# Patient Record
Sex: Female | Born: 1945 | Race: Black or African American | Hispanic: No | Smoking: Former smoker
Health system: Southern US, Community
[De-identification: ages and names within clinical notes are randomized; demographics above are authoritative.]

## PROBLEM LIST (undated history)

## (undated) DIAGNOSIS — E7800 Pure hypercholesterolemia, unspecified: Secondary | ICD-10-CM

## (undated) DIAGNOSIS — E119 Type 2 diabetes mellitus without complications: Secondary | ICD-10-CM

## (undated) DIAGNOSIS — I10 Essential (primary) hypertension: Secondary | ICD-10-CM

## (undated) DIAGNOSIS — G4733 Obstructive sleep apnea (adult) (pediatric): Secondary | ICD-10-CM

## (undated) HISTORY — PX: ABDOMINAL HYSTERECTOMY: SHX81

## (undated) HISTORY — PX: FOOT SURGERY: SHX648

## (undated) HISTORY — DX: Obstructive sleep apnea (adult) (pediatric): G47.33

---

## 2014-12-26 DIAGNOSIS — I10 Essential (primary) hypertension: Secondary | ICD-10-CM | POA: Diagnosis not present

## 2014-12-26 DIAGNOSIS — Z0000 Encounter for general adult medical examination without abnormal findings: Secondary | ICD-10-CM | POA: Diagnosis not present

## 2014-12-26 DIAGNOSIS — Z79899 Other long term (current) drug therapy: Secondary | ICD-10-CM | POA: Diagnosis not present

## 2014-12-26 DIAGNOSIS — E785 Hyperlipidemia, unspecified: Secondary | ICD-10-CM | POA: Diagnosis not present

## 2014-12-26 DIAGNOSIS — E119 Type 2 diabetes mellitus without complications: Secondary | ICD-10-CM | POA: Diagnosis not present

## 2014-12-27 ENCOUNTER — Other Ambulatory Visit: Payer: Self-pay | Admitting: Family Medicine

## 2014-12-27 ENCOUNTER — Other Ambulatory Visit: Payer: Self-pay

## 2014-12-27 DIAGNOSIS — Z1231 Encounter for screening mammogram for malignant neoplasm of breast: Secondary | ICD-10-CM

## 2015-01-06 DIAGNOSIS — E785 Hyperlipidemia, unspecified: Secondary | ICD-10-CM | POA: Diagnosis not present

## 2015-01-06 DIAGNOSIS — E119 Type 2 diabetes mellitus without complications: Secondary | ICD-10-CM | POA: Diagnosis not present

## 2015-01-09 ENCOUNTER — Ambulatory Visit
Admission: RE | Admit: 2015-01-09 | Discharge: 2015-01-09 | Disposition: A | Discharge status: Home / Self Care | Payer: Commercial Managed Care - HMO | Source: Ambulatory Visit | Attending: Family Medicine | Admitting: Family Medicine

## 2015-01-09 ENCOUNTER — Encounter (INDEPENDENT_AMBULATORY_CARE_PROVIDER_SITE_OTHER): Payer: Self-pay

## 2015-01-09 DIAGNOSIS — Z1231 Encounter for screening mammogram for malignant neoplasm of breast: Secondary | ICD-10-CM

## 2015-04-09 DIAGNOSIS — E119 Type 2 diabetes mellitus without complications: Secondary | ICD-10-CM | POA: Diagnosis not present

## 2015-04-09 DIAGNOSIS — Z961 Presence of intraocular lens: Secondary | ICD-10-CM | POA: Diagnosis not present

## 2015-04-09 DIAGNOSIS — H26492 Other secondary cataract, left eye: Secondary | ICD-10-CM | POA: Diagnosis not present

## 2015-04-23 DIAGNOSIS — H26492 Other secondary cataract, left eye: Secondary | ICD-10-CM | POA: Diagnosis not present

## 2015-07-02 DIAGNOSIS — I10 Essential (primary) hypertension: Secondary | ICD-10-CM | POA: Diagnosis not present

## 2015-07-02 DIAGNOSIS — T464X5A Adverse effect of angiotensin-converting-enzyme inhibitors, initial encounter: Secondary | ICD-10-CM | POA: Diagnosis not present

## 2015-07-02 DIAGNOSIS — E119 Type 2 diabetes mellitus without complications: Secondary | ICD-10-CM | POA: Diagnosis not present

## 2015-07-02 DIAGNOSIS — E785 Hyperlipidemia, unspecified: Secondary | ICD-10-CM | POA: Diagnosis not present

## 2015-07-02 DIAGNOSIS — Z720 Tobacco use: Secondary | ICD-10-CM | POA: Diagnosis not present

## 2015-07-02 DIAGNOSIS — R05 Cough: Secondary | ICD-10-CM | POA: Diagnosis not present

## 2015-07-02 DIAGNOSIS — K5900 Constipation, unspecified: Secondary | ICD-10-CM | POA: Diagnosis not present

## 2015-07-02 DIAGNOSIS — R5383 Other fatigue: Secondary | ICD-10-CM | POA: Diagnosis not present

## 2015-07-17 DIAGNOSIS — R946 Abnormal results of thyroid function studies: Secondary | ICD-10-CM | POA: Diagnosis not present

## 2015-11-20 DIAGNOSIS — E119 Type 2 diabetes mellitus without complications: Secondary | ICD-10-CM | POA: Diagnosis not present

## 2015-11-20 DIAGNOSIS — Z961 Presence of intraocular lens: Secondary | ICD-10-CM | POA: Diagnosis not present

## 2015-11-20 DIAGNOSIS — H43812 Vitreous degeneration, left eye: Secondary | ICD-10-CM | POA: Diagnosis not present

## 2016-03-08 ENCOUNTER — Ambulatory Visit (HOSPITAL_COMMUNITY)
Admission: EM | Admit: 2016-03-08 | Discharge: 2016-03-08 | Disposition: A | Discharge status: Home / Self Care | Payer: No Typology Code available for payment source | Attending: Family Medicine | Admitting: Family Medicine

## 2016-03-08 ENCOUNTER — Encounter (HOSPITAL_COMMUNITY): Payer: Self-pay | Admitting: *Deleted

## 2016-03-08 DIAGNOSIS — M545 Low back pain: Secondary | ICD-10-CM | POA: Diagnosis not present

## 2016-03-08 DIAGNOSIS — Z041 Encounter for examination and observation following transport accident: Secondary | ICD-10-CM

## 2016-03-08 HISTORY — DX: Type 2 diabetes mellitus without complications: E11.9

## 2016-03-08 HISTORY — DX: Pure hypercholesterolemia, unspecified: E78.00

## 2016-03-08 HISTORY — DX: Essential (primary) hypertension: I10

## 2016-03-08 MED ORDER — TRAMADOL HCL 50 MG PO TABS: 50.0000 mg | ORAL_TABLET | Freq: Four times a day (QID) | ORAL | Status: DC | PRN
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
## 2016-03-22 ENCOUNTER — Other Ambulatory Visit: Payer: Self-pay | Admitting: Family Medicine

## 2016-03-22 DIAGNOSIS — E2839 Other primary ovarian failure: Secondary | ICD-10-CM

## 2016-03-22 DIAGNOSIS — Z1231 Encounter for screening mammogram for malignant neoplasm of breast: Secondary | ICD-10-CM

## 2016-04-13 ENCOUNTER — Ambulatory Visit
Admission: RE | Admit: 2016-04-13 | Discharge: 2016-04-13 | Disposition: A | Discharge status: Home / Self Care | Payer: Medicare Other | Source: Ambulatory Visit | Attending: Family Medicine | Admitting: Family Medicine

## 2016-04-13 DIAGNOSIS — E2839 Other primary ovarian failure: Secondary | ICD-10-CM

## 2016-04-13 DIAGNOSIS — Z1231 Encounter for screening mammogram for malignant neoplasm of breast: Secondary | ICD-10-CM

## 2016-09-06 ENCOUNTER — Ambulatory Visit (INDEPENDENT_AMBULATORY_CARE_PROVIDER_SITE_OTHER): Payer: Medicare Other

## 2016-09-06 ENCOUNTER — Ambulatory Visit (HOSPITAL_COMMUNITY)
Admission: EM | Admit: 2016-09-06 | Discharge: 2016-09-06 | Disposition: A | Discharge status: Home / Self Care | Payer: Medicare Other | Attending: Family Medicine | Admitting: Family Medicine

## 2016-09-06 ENCOUNTER — Encounter (HOSPITAL_COMMUNITY): Payer: Self-pay | Admitting: *Deleted

## 2016-09-06 DIAGNOSIS — J209 Acute bronchitis, unspecified: Secondary | ICD-10-CM | POA: Diagnosis not present

## 2016-09-06 MED ORDER — IPRATROPIUM BROMIDE 0.06 % NA SOLN: 2.0000 | Freq: Four times a day (QID) | NASAL | 1 refills | Status: DC

## 2016-09-06 MED ORDER — LEVOFLOXACIN 500 MG PO TABS: 500.0000 mg | ORAL_TABLET | Freq: Every day | ORAL | 0 refills | Status: DC

## 2016-09-06 MED ORDER — GUAIFENESIN-CODEINE 100-10 MG/5ML PO SYRP: 10.0000 mL | ORAL_SOLUTION | Freq: Four times a day (QID) | ORAL | 0 refills | Status: DC | PRN

## 2016-09-06 NOTE — ED Triage Notes (Signed)
Pt  Reports   About  4  Days  Ago  Developed  A  Dry  Non  Productive   Cough  With  stuffyness   Pt  Reports    Symptoms  Began  About  4  Days  Ago

## 2016-09-06 NOTE — ED Provider Notes (Signed)
[facility]    CSN: [id number] Arrival date & time: 09/06/16  1906     History   Chief Complaint Chief Complaint  Patient presents with  . Cough    HPI Jacqueline Barnett is a 70 y.o. female.   The history is provided by the patient.  Cough  Cough characteristics:  Non-productive, dry and harsh Severity:  Moderate Onset quality:  Gradual Duration:  4 days Progression:  Worsening Chronicity:  New Smoker: yes   Context: smoke exposure   Relieved by:  None tried Worsened by:  Nothing Ineffective treatments:  None tried Associated symptoms: no fever, no rhinorrhea and no sore throat     Past Medical History:  Diagnosis Date  . Diabetes mellitus without complication ([place])   . Hypercholesteremia   . Hypertension     There are no active problems to display for this patient.   Past Surgical History:  Procedure Laterality Date  . ABDOMINAL HYSTERECTOMY    . FOOT SURGERY      OB History    No data available       Home Medications    Prior to Admission medications   Medication Sig Start Date End Date Taking? Authorizing Provider  aspirin 81 MG chewable tablet Chew by mouth daily.   Yes Historical Provider, MD  hydrochlorothiazide (HYDRODIURIL) 25 MG tablet Take 25 mg by mouth daily.   Yes Historical Provider, MD  losartan (COZAAR) 50 MG tablet Take 50 mg by mouth daily.   Yes Historical Provider, MD  METFORMIN HCL PO Take by mouth.    Historical Provider, MD  Omeprazole (PRILOSEC PO) Take by mouth.    Historical Provider, MD  traMADol (ULTRAM) 50 MG tablet Take 1 tablet (50 mg total) by mouth every 6 (six) hours as needed. 03/08/16   Billy Fischer, MD    Family History History reviewed. No pertinent family history.  Social History Social History  Substance Use Topics  . Smoking status: Current Every Day Smoker  . Smokeless tobacco: Not on file  . Alcohol use No     Allergies   Patient has no known allergies.   Review of Systems Review of  Systems  Constitutional: Negative.  Negative for fever.  HENT: Positive for congestion. Negative for rhinorrhea and sore throat.   Respiratory: Positive for cough.   Cardiovascular: Negative.   Gastrointestinal: Negative.   All other systems reviewed and are negative.    Physical Exam Triage Vital Signs ED Triage Vitals [09/06/16 1946]  Enc Vitals Group     BP 151/84     Pulse Rate 68     Resp 16     Temp 98.1 F (36.7 C)     Temp Source Oral     SpO2 99 %     Weight      Height      Head Circumference      Peak Flow      Pain Score      Pain Loc      Pain Edu?      Excl. in [place]?    No data found.   Updated Vital Signs BP 151/84 (BP Location: Left Arm)   Pulse 68   Temp 98.1 F (36.7 C) (Oral)   Resp 16   SpO2 99%   Visual Acuity Right Eye Distance:   Left Eye Distance:   Bilateral Distance:    Right Eye Near:   Left Eye Near:    Bilateral Near:  Physical Exam  Constitutional: She is oriented to person, place, and time. She appears well-developed and well-nourished.  HENT:  Mouth/Throat: Oropharynx is clear and moist.  Eyes: Conjunctivae are normal. Pupils are equal, round, and reactive to light.  Neck: Normal range of motion. Neck supple.  Cardiovascular: Normal rate, regular rhythm, normal heart sounds and intact distal pulses.   Pulmonary/Chest: Effort normal.  Lymphadenopathy:    She has no cervical adenopathy.  Neurological: She is alert and oriented to person, place, and time.  Skin: Skin is warm and dry.  Nursing note and vitals reviewed.    UC Treatments / Results  Labs (all labs ordered are listed, but only abnormal results are displayed) Labs Reviewed - No data to display  EKG  EKG Interpretation None       Radiology No results found. X-rays reviewed and report per radiologist.  Procedures Procedures (including critical care time)  Medications Ordered in UC Medications - No data to display   Initial Impression /  Assessment and Plan / UC Course  I have reviewed the triage vital signs and the nursing notes.  Pertinent labs & imaging results that were available during my care of the patient were reviewed by me and considered in my medical decision making (see chart for details).  Clinical Course       Final Clinical Impressions(s) / UC Diagnoses   Final diagnoses:  None    New Prescriptions New Prescriptions   No medications on file     Billy Fischer, MD 09/06/16 2048

## 2016-09-06 NOTE — Discharge Instructions (Signed)
Drink plenty of fluids as discussed, use medicine as prescribed .Return or see your doctor if further problems . No more smoking.

## 2016-11-23 ENCOUNTER — Ambulatory Visit (INDEPENDENT_AMBULATORY_CARE_PROVIDER_SITE_OTHER): Payer: Medicare Other | Admitting: Orthopaedic Surgery

## 2016-11-23 ENCOUNTER — Ambulatory Visit (INDEPENDENT_AMBULATORY_CARE_PROVIDER_SITE_OTHER): Payer: Medicare Other

## 2016-11-23 DIAGNOSIS — G8929 Other chronic pain: Secondary | ICD-10-CM

## 2016-11-23 DIAGNOSIS — M25561 Pain in right knee: Secondary | ICD-10-CM

## 2016-11-23 NOTE — Progress Notes (Signed)
   Office Visit Note   Patient: Jacqueline Barnett           Date of Birth: [birth year]           MRN: [id number] Visit Date: 11/23/2016              Requested by: Aura Dials, MD [phone] [address], [place], [place] [address] PCP: PROVIDER NOT IN SYSTEM   Assessment & Plan: Visit Diagnoses:  1. Chronic pain of right knee     Plan: right knee OA.  Injection given today. monovisc pamphlet and HEP given.  F/u prn.  Follow-Up Instructions: Return if symptoms worsen or fail to improve.   Orders:  Orders Placed This Encounter  Procedures  . XR KNEE 3 VIEW RIGHT   No orders of the defined types were placed in this encounter.     Procedures: No procedures performed   Clinical Data: No additional findings.   Subjective: Chief Complaint  Patient presents with  . Right Knee - Pain    71 yo female with right knee pain for 1.5 months after cleaning her bathtub.  Has generalized knee throbbing, stabbing pain with soreness.  It is stiff and aches all over.  Denies mechanical sxs.  Sometimes it radiates down to shin.      Review of Systems  Constitutional: Negative.   HENT: Negative.   Eyes: Negative.   Respiratory: Negative.   Cardiovascular: Negative.   Endocrine: Negative.   Musculoskeletal: Negative.   Neurological: Negative.   Hematological: Negative.   Psychiatric/Behavioral: Negative.   All other systems reviewed and are negative.    Objective: Vital Signs: There were no vitals taken for this visit.  Physical Exam  Constitutional: She is oriented to person, place, and time. She appears well-developed and well-nourished.  HENT:  Head: Normocephalic and atraumatic.  Eyes: EOM are normal.  Neck: Neck supple.  Pulmonary/Chest: Effort normal.  Abdominal: Soft.  Neurological: She is alert and oriented to person, place, and time.  Skin: Skin is warm. Capillary refill takes less than 2 seconds.  Psychiatric: She has a normal mood and affect. Her behavior is  normal. Judgment and thought content normal.  Nursing note and vitals reviewed.   Ortho Exam Right knee - no joint effusion - supple ROM - ligaments stable Specialty Comments:  No specialty comments available.  Imaging: Xr Knee 3 View Right  Result Date: 11/23/2016 Moderate DJD of right knee more pronounced in medial compartment    PMFS History: There are no active problems to display for this patient.  Past Medical History:  Diagnosis Date  . Diabetes mellitus without complication ([place])   . Hypercholesteremia   . Hypertension     No family history on file.  Past Surgical History:  Procedure Laterality Date  . ABDOMINAL HYSTERECTOMY    . FOOT SURGERY     Social History   Occupational History  . Not on file.   Social History Main Topics  . Smoking status: Current Every Day Smoker  . Smokeless tobacco: Not on file  . Alcohol use No  . Drug use: Unknown  . Sexual activity: Not on file

## 2017-02-01 ENCOUNTER — Encounter (INDEPENDENT_AMBULATORY_CARE_PROVIDER_SITE_OTHER): Payer: Self-pay

## 2017-02-01 ENCOUNTER — Ambulatory Visit (INDEPENDENT_AMBULATORY_CARE_PROVIDER_SITE_OTHER): Payer: Medicare Other | Admitting: Orthopaedic Surgery

## 2017-02-01 ENCOUNTER — Encounter (INDEPENDENT_AMBULATORY_CARE_PROVIDER_SITE_OTHER): Payer: Self-pay | Admitting: Orthopaedic Surgery

## 2017-02-01 DIAGNOSIS — M25561 Pain in right knee: Secondary | ICD-10-CM | POA: Diagnosis not present

## 2017-02-01 MED ORDER — MELOXICAM 7.5 MG PO TABS: 7.5000 mg | ORAL_TABLET | Freq: Two times a day (BID) | ORAL | 2 refills | Status: DC | PRN

## 2017-02-01 NOTE — Progress Notes (Signed)
   Office Visit Note   Patient: Jacqueline Barnett           Date of Birth: [birth year]           MRN: [id number] Visit Date: 02/01/2017              Requested by: No referring provider defined for this encounter. PCP: PROVIDER NOT IN SYSTEM   Assessment & Plan: Visit Diagnoses: No diagnosis found.  Plan: Recommend MRI to rule out medial meniscal tear in medial tibial plateau insufficiency fracture. Meloxicam prescribed. Follow-up in about 10 days to review the MRI. If MRI does not show meniscal tear we'll submit application from Monovisc injection.  Follow-Up Instructions: Return in about 10 days (around 02/11/2017).   Orders:  No orders of the defined types were placed in this encounter.  Meds ordered this encounter  Medications  . meloxicam (MOBIC) 7.5 MG tablet    Sig: Take 1 tablet (7.5 mg total) by mouth 2 (two) times daily as needed for pain.    Dispense:  30 tablet    Refill:  2      Procedures: No procedures performed   Clinical Data: No additional findings.   Subjective: Chief Complaint  Patient presents with  . Right Knee - Pain, Follow-up    Patient follows up today for continued right knee pain. The cortisone injection gave her one week relief. She continues to have giving way and popping significant mechanical symptoms.    Review of Systems  Constitutional: Negative.   HENT: Negative.   Eyes: Negative.   Respiratory: Negative.   Cardiovascular: Negative.   Endocrine: Negative.   Musculoskeletal: Negative.   Neurological: Negative.   Hematological: Negative.   Psychiatric/Behavioral: Negative.   All other systems reviewed and are negative.    Objective: Vital Signs: There were no vitals taken for this visit.  Physical Exam  Constitutional: She is oriented to person, place, and time. She appears well-developed and well-nourished.  Pulmonary/Chest: Effort normal.  Neurological: She is alert and oriented to person, place, and time.  Skin: Skin is  warm. Capillary refill takes less than 2 seconds.  Psychiatric: She has a normal mood and affect. Her behavior is normal. Judgment and thought content normal.  Nursing note and vitals reviewed.   Ortho Exam Right knee exam shows no joint effusion. She does have significant medial joint line tenderness and proximal medial tibia pain with palpation. Collaterals and cruciates are stable. Specialty Comments:  No specialty comments available.  Imaging: No results found.   PMFS History: There are no active problems to display for this patient.  Past Medical History:  Diagnosis Date  . Diabetes mellitus without complication ([place])   . Hypercholesteremia   . Hypertension     No family history on file.  Past Surgical History:  Procedure Laterality Date  . ABDOMINAL HYSTERECTOMY    . FOOT SURGERY     Social History   Occupational History  . Not on file.   Social History Main Topics  . Smoking status: Current Every Day Smoker  . Smokeless tobacco: Never Used  . Alcohol use No  . Drug use: Unknown  . Sexual activity: Not on file

## 2017-02-15 ENCOUNTER — Encounter (INDEPENDENT_AMBULATORY_CARE_PROVIDER_SITE_OTHER): Payer: Self-pay

## 2017-02-15 ENCOUNTER — Ambulatory Visit (INDEPENDENT_AMBULATORY_CARE_PROVIDER_SITE_OTHER): Payer: Medicare Other | Admitting: Orthopaedic Surgery

## 2017-02-15 ENCOUNTER — Encounter (INDEPENDENT_AMBULATORY_CARE_PROVIDER_SITE_OTHER): Payer: Self-pay | Admitting: Orthopaedic Surgery

## 2017-02-15 DIAGNOSIS — M25561 Pain in right knee: Secondary | ICD-10-CM

## 2017-02-18 ENCOUNTER — Ambulatory Visit (INDEPENDENT_AMBULATORY_CARE_PROVIDER_SITE_OTHER): Payer: Medicare Other | Admitting: Orthopaedic Surgery

## 2017-02-18 ENCOUNTER — Encounter (INDEPENDENT_AMBULATORY_CARE_PROVIDER_SITE_OTHER): Payer: Self-pay

## 2017-02-21 ENCOUNTER — Telehealth (INDEPENDENT_AMBULATORY_CARE_PROVIDER_SITE_OTHER): Payer: Self-pay

## 2017-02-21 NOTE — Telephone Encounter (Signed)
Called patient to let her know Monovisc was approved by ins company and it will pay 80% of inj. I advised her she could call insurance company to know exactly what she will have to pay. She will call us to let us know if she would like to go ahead and schedule appt.

## 2017-02-23 ENCOUNTER — Ambulatory Visit
Admission: RE | Admit: 2017-02-23 | Discharge: 2017-02-23 | Disposition: A | Discharge status: Home / Self Care | Payer: Medicare Other | Source: Ambulatory Visit | Attending: Orthopaedic Surgery | Admitting: Orthopaedic Surgery

## 2017-02-23 DIAGNOSIS — M25561 Pain in right knee: Secondary | ICD-10-CM

## 2017-02-25 ENCOUNTER — Ambulatory Visit (INDEPENDENT_AMBULATORY_CARE_PROVIDER_SITE_OTHER): Payer: Medicare Other | Admitting: Orthopaedic Surgery

## 2017-02-25 DIAGNOSIS — S83281A Other tear of lateral meniscus, current injury, right knee, initial encounter: Secondary | ICD-10-CM | POA: Diagnosis not present

## 2017-02-25 MED ORDER — DICLOFENAC SODIUM 75 MG PO TBEC: 75.0000 mg | DELAYED_RELEASE_TABLET | Freq: Two times a day (BID) | ORAL | 2 refills | Status: DC

## 2017-02-25 NOTE — Progress Notes (Signed)
   Office Visit Note   Patient: Jacqueline Barnett           Date of Birth: [birth year]           MRN: [id number] Visit Date: 02/25/2017              Requested by: No referring provider defined for this encounter. PCP: Patient, No Pcp Per   Assessment & Plan: Visit Diagnoses:  1. Acute lateral meniscus tear of right knee, initial encounter     Plan: MRI shows mild to moderate degenerative joint disease with lateral meniscal tear. She also has a degenerated medial meniscus. I discussed these findings with her and her daughter. We discussed that we will wait to hear about her Monovisc injection and if the co-pays too much we can consider arthroscopic debridement but she understands that there is a risk that he may not give her pain relief with the procedure. She understands and she'll let us know. I prescribed her diclofenac.  Follow-Up Instructions: Return if symptoms worsen or fail to improve.   Orders:  No orders of the defined types were placed in this encounter.  Meds ordered this encounter  Medications  . diclofenac (VOLTAREN) 75 MG EC tablet    Sig: Take 1 tablet (75 mg total) by mouth 2 (two) times daily.    Dispense:  30 tablet    Refill:  2      Procedures: No procedures performed   Clinical Data: No additional findings.   Subjective: No chief complaint on file.   Patient follows up today to review her MRI. She continues to have pain of her right knee with subjective mechanical symptoms. She denies any swelling.    Review of Systems  Constitutional: Negative.   HENT: Negative.   Eyes: Negative.   Respiratory: Negative.   Cardiovascular: Negative.   Endocrine: Negative.   Musculoskeletal: Negative.   Neurological: Negative.   Hematological: Negative.   Psychiatric/Behavioral: Negative.   All other systems reviewed and are negative.    Objective: Vital Signs: There were no vitals taken for this visit.  Physical Exam  Constitutional: She is oriented to  person, place, and time. She appears well-developed and well-nourished.  Pulmonary/Chest: Effort normal.  Neurological: She is alert and oriented to person, place, and time.  Skin: Skin is warm. Capillary refill takes less than 2 seconds.  Psychiatric: She has a normal mood and affect. Her behavior is normal. Judgment and thought content normal.  Nursing note and vitals reviewed.   Ortho Exam Right knee exam shows no joint effusion. She has medial joint line tenderness. She has pain with McMurray testing on the medial side. Specialty Comments:  No specialty comments available.  Imaging: No results found.   PMFS History: There are no active problems to display for this patient.  Past Medical History:  Diagnosis Date  . Diabetes mellitus without complication ([place])   . Hypercholesteremia   . Hypertension     No family history on file.  Past Surgical History:  Procedure Laterality Date  . ABDOMINAL HYSTERECTOMY    . FOOT SURGERY     Social History   Occupational History  . Not on file.   Social History Main Topics  . Smoking status: Current Every Day Smoker  . Smokeless tobacco: Never Used  . Alcohol use No  . Drug use: Unknown  . Sexual activity: Not on file

## 2017-03-07 ENCOUNTER — Other Ambulatory Visit: Payer: Self-pay | Admitting: Internal Medicine

## 2017-03-07 DIAGNOSIS — Z1231 Encounter for screening mammogram for malignant neoplasm of breast: Secondary | ICD-10-CM

## 2017-04-14 ENCOUNTER — Ambulatory Visit
Admission: RE | Admit: 2017-04-14 | Discharge: 2017-04-14 | Disposition: A | Discharge status: Home / Self Care | Payer: Medicare Other | Source: Ambulatory Visit | Attending: Internal Medicine | Admitting: Internal Medicine

## 2017-04-14 DIAGNOSIS — Z1231 Encounter for screening mammogram for malignant neoplasm of breast: Secondary | ICD-10-CM

## 2017-04-15 ENCOUNTER — Other Ambulatory Visit: Payer: Self-pay | Admitting: Internal Medicine

## 2017-04-15 DIAGNOSIS — R928 Other abnormal and inconclusive findings on diagnostic imaging of breast: Secondary | ICD-10-CM

## 2017-04-21 ENCOUNTER — Other Ambulatory Visit: Payer: Self-pay | Admitting: Internal Medicine

## 2017-04-21 ENCOUNTER — Ambulatory Visit
Admission: RE | Admit: 2017-04-21 | Discharge: 2017-04-21 | Disposition: A | Discharge status: Home / Self Care | Payer: Medicare Other | Source: Ambulatory Visit | Attending: Internal Medicine | Admitting: Internal Medicine

## 2017-04-21 DIAGNOSIS — R928 Other abnormal and inconclusive findings on diagnostic imaging of breast: Secondary | ICD-10-CM

## 2017-04-21 DIAGNOSIS — M7989 Other specified soft tissue disorders: Secondary | ICD-10-CM

## 2017-05-14 NOTE — Progress Notes (Signed)
cancelled

## 2017-05-27 ENCOUNTER — Other Ambulatory Visit: Payer: Self-pay | Admitting: Internal Medicine

## 2017-05-27 DIAGNOSIS — E041 Nontoxic single thyroid nodule: Secondary | ICD-10-CM

## 2017-05-27 DIAGNOSIS — F17210 Nicotine dependence, cigarettes, uncomplicated: Secondary | ICD-10-CM

## 2017-06-13 ENCOUNTER — Ambulatory Visit
Admission: RE | Admit: 2017-06-13 | Discharge: 2017-06-13 | Disposition: A | Discharge status: Home / Self Care | Payer: Medicare Other | Source: Ambulatory Visit | Attending: Internal Medicine | Admitting: Internal Medicine

## 2017-06-13 DIAGNOSIS — E041 Nontoxic single thyroid nodule: Secondary | ICD-10-CM

## 2017-06-13 DIAGNOSIS — F17210 Nicotine dependence, cigarettes, uncomplicated: Secondary | ICD-10-CM

## 2017-06-16 ENCOUNTER — Other Ambulatory Visit: Payer: Self-pay | Admitting: Internal Medicine

## 2017-06-16 DIAGNOSIS — E041 Nontoxic single thyroid nodule: Secondary | ICD-10-CM

## 2017-06-22 ENCOUNTER — Telehealth (INDEPENDENT_AMBULATORY_CARE_PROVIDER_SITE_OTHER): Payer: Self-pay

## 2017-06-22 NOTE — Telephone Encounter (Signed)
Patient called [facility] and they said they will ship out injection on 9/25/018. Scheduled appt for 07/04/17.

## 2017-06-22 NOTE — Telephone Encounter (Signed)
Received an update from [facility] they said that patient has to call [facility] [phone]  To give consent so they can ship the Injection out. Patient states she was approved for financial assistance for the medication.   Called patient for to call [facility] and patient will call me back after speaking to them.  MONOVISC RIGHT KNEE

## 2017-06-23 ENCOUNTER — Other Ambulatory Visit (HOSPITAL_COMMUNITY)
Admission: RE | Admit: 2017-06-23 | Discharge: 2017-06-23 | Disposition: A | Discharge status: Home / Self Care | Payer: Medicare Other | Source: Ambulatory Visit | Attending: Diagnostic Radiology | Admitting: Diagnostic Radiology

## 2017-06-23 ENCOUNTER — Ambulatory Visit
Admission: RE | Admit: 2017-06-23 | Discharge: 2017-06-23 | Disposition: A | Discharge status: Home / Self Care | Payer: Medicare Other | Source: Ambulatory Visit | Attending: Internal Medicine | Admitting: Internal Medicine

## 2017-06-23 DIAGNOSIS — E041 Nontoxic single thyroid nodule: Secondary | ICD-10-CM | POA: Insufficient documentation

## 2017-07-04 ENCOUNTER — Ambulatory Visit (INDEPENDENT_AMBULATORY_CARE_PROVIDER_SITE_OTHER): Payer: Medicare Other | Admitting: Orthopaedic Surgery

## 2017-07-11 ENCOUNTER — Ambulatory Visit (INDEPENDENT_AMBULATORY_CARE_PROVIDER_SITE_OTHER): Payer: Medicare Other | Admitting: Orthopaedic Surgery

## 2017-07-11 DIAGNOSIS — M1711 Unilateral primary osteoarthritis, right knee: Secondary | ICD-10-CM

## 2017-07-11 MED ORDER — HYALURONAN 88 MG/4ML IX SOSY
88.0000 mg | PREFILLED_SYRINGE | INTRA_ARTICULAR | Status: AC | PRN
  Administered 2017-07-11: 88 mg via INTRA_ARTICULAR

## 2017-07-11 NOTE — Progress Notes (Signed)
   Procedure Note  Patient: Jacqueline Barnett             Date of Birth: [birth year]           MRN: [id number]             Visit Date: 07/11/2017  Procedures: Visit Diagnoses: Unilateral primary osteoarthritis, right knee  Large Joint Inj Date/Time: 07/11/2017 4:40 PM Performed by: Leandrew Koyanagi Authorized by: Leandrew Koyanagi   Consent Given by:  Patient Timeout: prior to procedure the correct patient, procedure, and site was verified   Indications:  Pain Location:  Knee Site:  R knee Prep: patient was prepped and draped in usual sterile fashion   Needle Size:  22 G Approach:  Anterolateral Ultrasound Guidance: No   Fluoroscopic Guidance: No   Arthrogram: No   Medications:  88 mg Hyaluronan 88 MG/4ML

## 2017-07-26 ENCOUNTER — Ambulatory Visit
Admission: RE | Admit: 2017-07-26 | Discharge: 2017-07-26 | Disposition: A | Discharge status: Home / Self Care | Payer: Medicare Other | Source: Ambulatory Visit | Attending: Internal Medicine | Admitting: Internal Medicine

## 2017-07-26 DIAGNOSIS — M7989 Other specified soft tissue disorders: Secondary | ICD-10-CM

## 2017-09-05 ENCOUNTER — Encounter (INDEPENDENT_AMBULATORY_CARE_PROVIDER_SITE_OTHER): Payer: Self-pay | Admitting: Orthopaedic Surgery

## 2017-09-05 ENCOUNTER — Ambulatory Visit (INDEPENDENT_AMBULATORY_CARE_PROVIDER_SITE_OTHER): Payer: Medicare Other | Admitting: Orthopaedic Surgery

## 2017-09-05 DIAGNOSIS — M1712 Unilateral primary osteoarthritis, left knee: Secondary | ICD-10-CM

## 2017-09-05 MED ORDER — DICLOFENAC SODIUM 75 MG PO TBEC: 75.0000 mg | DELAYED_RELEASE_TABLET | Freq: Two times a day (BID) | ORAL | 2 refills | Status: DC

## 2017-09-05 MED ORDER — METHYLPREDNISOLONE ACETATE 40 MG/ML IJ SUSP
40.0000 mg | INTRAMUSCULAR | Status: AC | PRN
  Administered 2017-09-05: 40 mg via INTRA_ARTICULAR

## 2017-09-05 MED ORDER — LIDOCAINE HCL 1 % IJ SOLN
2.0000 mL | INTRAMUSCULAR | Status: AC | PRN
  Administered 2017-09-05: 2 mL

## 2017-09-05 MED ORDER — DICLOFENAC SODIUM 1 % TD GEL: 2.0000 g | Freq: Four times a day (QID) | TRANSDERMAL | 5 refills | Status: DC

## 2017-09-05 MED ORDER — BUPIVACAINE HCL 0.5 % IJ SOLN
2.0000 mL | INTRAMUSCULAR | Status: AC | PRN
  Administered 2017-09-05: 2 mL via INTRA_ARTICULAR

## 2017-09-05 NOTE — Progress Notes (Signed)
Office Visit Note   Patient: Jacqueline Barnett           Date of Birth: May 19, 1946           MRN: [id number] Visit Date: 09/05/2017              Requested by: No referring provider defined for this encounter. PCP: Patient, No Pcp Per   Assessment & Plan: Visit Diagnoses:  1. Unilateral primary osteoarthritis, left knee     Plan: Left knee was aspirated and injected today.  Prescription for topical and oral diclofenac.  She will follow-up when she is ready for a Monovisc injection for the right knee.    Follow-Up Instructions: Return if symptoms worsen or fail to improve.   Orders:  No orders of the defined types were placed in this encounter.  Meds ordered this encounter  Medications  . diclofenac (VOLTAREN) 75 MG EC tablet    Sig: Take 1 tablet (75 mg total) by mouth 2 (two) times daily.    Dispense:  30 tablet    Refill:  2  . diclofenac sodium (VOLTAREN) 1 % GEL    Sig: Apply 2 g topically 4 (four) times daily.    Dispense:  1 Tube    Refill:  5      Procedures: Large Joint Inj: L knee on 09/05/2017 8:31 PM Details: 22 G needle Medications: 2 mL bupivacaine 0.5 %; 2 mL lidocaine 1 %; 40 mg methylPREDNISolone acetate 40 MG/ML Aspirate: 25 mL blood-tinged Outcome: tolerated well, no immediate complications Patient was prepped and draped in the usual sterile fashion.       Clinical Data: No additional findings.   Subjective: Chief Complaint  Patient presents with  . Right Knee - Pain  . Left Knee - Pain    Patient follows up today for mainly left knee pain and swelling.  She received a Monovisc injections in her right knee in October and she has improved since then.  She denies any injuries to her left knee.    Review of Systems  Constitutional: Negative.   HENT: Negative.   Eyes: Negative.   Respiratory: Negative.   Cardiovascular: Negative.   Endocrine: Negative.   Musculoskeletal: Negative.   Neurological: Negative.   Hematological: Negative.     Psychiatric/Behavioral: Negative.   All other systems reviewed and are negative.    Objective: Vital Signs: There were no vitals taken for this visit.  Physical Exam  Constitutional: She is oriented to person, place, and time. She appears well-developed and well-nourished.  Pulmonary/Chest: Effort normal.  Neurological: She is alert and oriented to person, place, and time.  Skin: Skin is warm. Capillary refill takes less than 2 seconds.  Psychiatric: She has a normal mood and affect. Her behavior is normal. Judgment and thought content normal.  Nursing note and vitals reviewed.   Ortho Exam Left knee exam shows no effusion.  Collaterals and cruciates are stable. Specialty Comments:  No specialty comments available.  Imaging: No results found.   PMFS History: There are no active problems to display for this patient.  Past Medical History:  Diagnosis Date  . Diabetes mellitus without complication ([place])   . Hypercholesteremia   . Hypertension     History reviewed. No pertinent family history.  Past Surgical History:  Procedure Laterality Date  . ABDOMINAL HYSTERECTOMY    . FOOT SURGERY     Social History   Occupational History  . Not on file  Tobacco Use  .  Smoking status: Current Every Day Smoker  . Smokeless tobacco: Never Used  Substance and Sexual Activity  . Alcohol use: No  . Drug use: Not on file  . Sexual activity: Not on file

## 2017-10-10 DIAGNOSIS — E785 Hyperlipidemia, unspecified: Secondary | ICD-10-CM | POA: Diagnosis not present

## 2017-10-10 DIAGNOSIS — Z23 Encounter for immunization: Secondary | ICD-10-CM | POA: Diagnosis not present

## 2017-10-10 DIAGNOSIS — E119 Type 2 diabetes mellitus without complications: Secondary | ICD-10-CM | POA: Diagnosis not present

## 2017-10-10 DIAGNOSIS — J439 Emphysema, unspecified: Secondary | ICD-10-CM | POA: Diagnosis not present

## 2017-10-10 DIAGNOSIS — E041 Nontoxic single thyroid nodule: Secondary | ICD-10-CM | POA: Diagnosis not present

## 2017-10-10 DIAGNOSIS — I10 Essential (primary) hypertension: Secondary | ICD-10-CM | POA: Diagnosis not present

## 2017-11-08 DIAGNOSIS — E119 Type 2 diabetes mellitus without complications: Secondary | ICD-10-CM | POA: Diagnosis not present

## 2017-11-08 DIAGNOSIS — Z961 Presence of intraocular lens: Secondary | ICD-10-CM | POA: Diagnosis not present

## 2017-11-08 DIAGNOSIS — H43812 Vitreous degeneration, left eye: Secondary | ICD-10-CM | POA: Diagnosis not present

## 2017-11-08 DIAGNOSIS — R6889 Other general symptoms and signs: Secondary | ICD-10-CM | POA: Diagnosis not present

## 2017-12-23 DIAGNOSIS — E119 Type 2 diabetes mellitus without complications: Secondary | ICD-10-CM | POA: Diagnosis not present

## 2017-12-23 DIAGNOSIS — E785 Hyperlipidemia, unspecified: Secondary | ICD-10-CM | POA: Diagnosis not present

## 2017-12-23 DIAGNOSIS — J439 Emphysema, unspecified: Secondary | ICD-10-CM | POA: Diagnosis not present

## 2017-12-23 DIAGNOSIS — I10 Essential (primary) hypertension: Secondary | ICD-10-CM | POA: Diagnosis not present

## 2017-12-23 DIAGNOSIS — Z7984 Long term (current) use of oral hypoglycemic drugs: Secondary | ICD-10-CM | POA: Diagnosis not present

## 2018-01-16 DIAGNOSIS — I10 Essential (primary) hypertension: Secondary | ICD-10-CM | POA: Diagnosis not present

## 2018-01-16 DIAGNOSIS — J439 Emphysema, unspecified: Secondary | ICD-10-CM | POA: Diagnosis not present

## 2018-01-16 DIAGNOSIS — E119 Type 2 diabetes mellitus without complications: Secondary | ICD-10-CM | POA: Diagnosis not present

## 2018-01-16 DIAGNOSIS — E1169 Type 2 diabetes mellitus with other specified complication: Secondary | ICD-10-CM | POA: Diagnosis not present

## 2018-01-16 DIAGNOSIS — Z7984 Long term (current) use of oral hypoglycemic drugs: Secondary | ICD-10-CM | POA: Diagnosis not present

## 2018-01-16 DIAGNOSIS — E785 Hyperlipidemia, unspecified: Secondary | ICD-10-CM | POA: Diagnosis not present

## 2018-02-06 DIAGNOSIS — E041 Nontoxic single thyroid nodule: Secondary | ICD-10-CM | POA: Diagnosis not present

## 2018-02-06 DIAGNOSIS — R6889 Other general symptoms and signs: Secondary | ICD-10-CM | POA: Diagnosis not present

## 2018-02-06 DIAGNOSIS — E785 Hyperlipidemia, unspecified: Secondary | ICD-10-CM | POA: Diagnosis not present

## 2018-02-06 DIAGNOSIS — I10 Essential (primary) hypertension: Secondary | ICD-10-CM | POA: Diagnosis not present

## 2018-02-06 DIAGNOSIS — Z7984 Long term (current) use of oral hypoglycemic drugs: Secondary | ICD-10-CM | POA: Diagnosis not present

## 2018-02-06 DIAGNOSIS — J439 Emphysema, unspecified: Secondary | ICD-10-CM | POA: Diagnosis not present

## 2018-02-06 DIAGNOSIS — E1169 Type 2 diabetes mellitus with other specified complication: Secondary | ICD-10-CM | POA: Diagnosis not present

## 2018-05-18 ENCOUNTER — Other Ambulatory Visit: Payer: Self-pay | Admitting: Internal Medicine

## 2018-05-18 DIAGNOSIS — Z1231 Encounter for screening mammogram for malignant neoplasm of breast: Secondary | ICD-10-CM

## 2018-06-19 DIAGNOSIS — K5904 Chronic idiopathic constipation: Secondary | ICD-10-CM | POA: Diagnosis not present

## 2018-06-19 DIAGNOSIS — R6889 Other general symptoms and signs: Secondary | ICD-10-CM | POA: Diagnosis not present

## 2018-06-19 DIAGNOSIS — Z7189 Other specified counseling: Secondary | ICD-10-CM | POA: Diagnosis not present

## 2018-06-19 DIAGNOSIS — E042 Nontoxic multinodular goiter: Secondary | ICD-10-CM | POA: Diagnosis not present

## 2018-06-19 DIAGNOSIS — J869 Pyothorax without fistula: Secondary | ICD-10-CM | POA: Diagnosis not present

## 2018-06-19 DIAGNOSIS — Z1159 Encounter for screening for other viral diseases: Secondary | ICD-10-CM | POA: Diagnosis not present

## 2018-06-19 DIAGNOSIS — E785 Hyperlipidemia, unspecified: Secondary | ICD-10-CM | POA: Diagnosis not present

## 2018-06-19 DIAGNOSIS — I10 Essential (primary) hypertension: Secondary | ICD-10-CM | POA: Diagnosis not present

## 2018-06-19 DIAGNOSIS — E119 Type 2 diabetes mellitus without complications: Secondary | ICD-10-CM | POA: Diagnosis not present

## 2018-06-19 DIAGNOSIS — Z1389 Encounter for screening for other disorder: Secondary | ICD-10-CM | POA: Diagnosis not present

## 2018-06-19 DIAGNOSIS — I700 Atherosclerosis of aorta: Secondary | ICD-10-CM | POA: Diagnosis not present

## 2018-06-19 DIAGNOSIS — Z0000 Encounter for general adult medical examination without abnormal findings: Secondary | ICD-10-CM | POA: Diagnosis not present

## 2018-06-19 DIAGNOSIS — Z23 Encounter for immunization: Secondary | ICD-10-CM | POA: Diagnosis not present

## 2018-06-20 ENCOUNTER — Ambulatory Visit
Admission: RE | Admit: 2018-06-20 | Discharge: 2018-06-20 | Disposition: A | Discharge status: Home / Self Care | Payer: Medicare HMO | Source: Ambulatory Visit | Attending: Internal Medicine | Admitting: Internal Medicine

## 2018-06-20 DIAGNOSIS — Z1231 Encounter for screening mammogram for malignant neoplasm of breast: Secondary | ICD-10-CM

## 2018-06-20 DIAGNOSIS — R6889 Other general symptoms and signs: Secondary | ICD-10-CM | POA: Diagnosis not present

## 2018-08-28 DIAGNOSIS — R6889 Other general symptoms and signs: Secondary | ICD-10-CM | POA: Diagnosis not present

## 2018-08-28 DIAGNOSIS — H43813 Vitreous degeneration, bilateral: Secondary | ICD-10-CM | POA: Diagnosis not present

## 2018-08-28 DIAGNOSIS — E119 Type 2 diabetes mellitus without complications: Secondary | ICD-10-CM | POA: Diagnosis not present

## 2018-08-28 DIAGNOSIS — Z961 Presence of intraocular lens: Secondary | ICD-10-CM | POA: Diagnosis not present

## 2019-04-23 DIAGNOSIS — Z961 Presence of intraocular lens: Secondary | ICD-10-CM | POA: Diagnosis not present

## 2019-04-23 DIAGNOSIS — H43813 Vitreous degeneration, bilateral: Secondary | ICD-10-CM | POA: Diagnosis not present

## 2019-04-23 DIAGNOSIS — E119 Type 2 diabetes mellitus without complications: Secondary | ICD-10-CM | POA: Diagnosis not present

## 2019-04-25 ENCOUNTER — Other Ambulatory Visit: Payer: Self-pay | Admitting: Internal Medicine

## 2019-04-25 DIAGNOSIS — Z1231 Encounter for screening mammogram for malignant neoplasm of breast: Secondary | ICD-10-CM

## 2019-06-26 ENCOUNTER — Other Ambulatory Visit: Payer: Self-pay

## 2019-06-26 ENCOUNTER — Ambulatory Visit
Admission: RE | Admit: 2019-06-26 | Discharge: 2019-06-26 | Disposition: A | Discharge status: Home / Self Care | Payer: Medicare HMO | Source: Ambulatory Visit | Attending: Internal Medicine | Admitting: Internal Medicine

## 2019-06-26 DIAGNOSIS — I10 Essential (primary) hypertension: Secondary | ICD-10-CM | POA: Diagnosis not present

## 2019-06-26 DIAGNOSIS — E042 Nontoxic multinodular goiter: Secondary | ICD-10-CM | POA: Diagnosis not present

## 2019-06-26 DIAGNOSIS — J869 Pyothorax without fistula: Secondary | ICD-10-CM | POA: Diagnosis not present

## 2019-06-26 DIAGNOSIS — R5383 Other fatigue: Secondary | ICD-10-CM | POA: Diagnosis not present

## 2019-06-26 DIAGNOSIS — K5904 Chronic idiopathic constipation: Secondary | ICD-10-CM | POA: Diagnosis not present

## 2019-06-26 DIAGNOSIS — Z1231 Encounter for screening mammogram for malignant neoplasm of breast: Secondary | ICD-10-CM

## 2019-06-26 DIAGNOSIS — Z0000 Encounter for general adult medical examination without abnormal findings: Secondary | ICD-10-CM | POA: Diagnosis not present

## 2019-06-26 DIAGNOSIS — E1169 Type 2 diabetes mellitus with other specified complication: Secondary | ICD-10-CM | POA: Diagnosis not present

## 2019-06-26 DIAGNOSIS — I700 Atherosclerosis of aorta: Secondary | ICD-10-CM | POA: Diagnosis not present

## 2019-06-26 DIAGNOSIS — M8580 Other specified disorders of bone density and structure, unspecified site: Secondary | ICD-10-CM | POA: Diagnosis not present

## 2019-06-26 DIAGNOSIS — E785 Hyperlipidemia, unspecified: Secondary | ICD-10-CM | POA: Diagnosis not present

## 2019-06-26 DIAGNOSIS — Z1389 Encounter for screening for other disorder: Secondary | ICD-10-CM | POA: Diagnosis not present

## 2019-07-31 DIAGNOSIS — H524 Presbyopia: Secondary | ICD-10-CM | POA: Diagnosis not present

## 2019-07-31 DIAGNOSIS — H52209 Unspecified astigmatism, unspecified eye: Secondary | ICD-10-CM | POA: Diagnosis not present

## 2019-07-31 DIAGNOSIS — H5213 Myopia, bilateral: Secondary | ICD-10-CM | POA: Diagnosis not present

## 2019-09-14 ENCOUNTER — Encounter (HOSPITAL_COMMUNITY): Payer: Self-pay

## 2019-09-14 ENCOUNTER — Ambulatory Visit (HOSPITAL_COMMUNITY)
Admission: EM | Admit: 2019-09-14 | Discharge: 2019-09-14 | Disposition: A | Discharge status: Home / Self Care | Payer: Medicare HMO | Attending: Family Medicine | Admitting: Family Medicine

## 2019-09-14 ENCOUNTER — Other Ambulatory Visit: Payer: Self-pay

## 2019-09-14 DIAGNOSIS — G43909 Migraine, unspecified, not intractable, without status migrainosus: Secondary | ICD-10-CM | POA: Diagnosis not present

## 2019-09-14 DIAGNOSIS — Z7982 Long term (current) use of aspirin: Secondary | ICD-10-CM | POA: Diagnosis not present

## 2019-09-14 DIAGNOSIS — Z791 Long term (current) use of non-steroidal anti-inflammatories (NSAID): Secondary | ICD-10-CM | POA: Diagnosis not present

## 2019-09-14 DIAGNOSIS — E119 Type 2 diabetes mellitus without complications: Secondary | ICD-10-CM | POA: Diagnosis not present

## 2019-09-14 DIAGNOSIS — I10 Essential (primary) hypertension: Secondary | ICD-10-CM | POA: Insufficient documentation

## 2019-09-14 DIAGNOSIS — U071 COVID-19: Secondary | ICD-10-CM | POA: Insufficient documentation

## 2019-09-14 DIAGNOSIS — R5383 Other fatigue: Secondary | ICD-10-CM | POA: Diagnosis not present

## 2019-09-14 DIAGNOSIS — E785 Hyperlipidemia, unspecified: Secondary | ICD-10-CM | POA: Insufficient documentation

## 2019-09-14 DIAGNOSIS — E7800 Pure hypercholesterolemia, unspecified: Secondary | ICD-10-CM | POA: Insufficient documentation

## 2019-09-14 DIAGNOSIS — F17200 Nicotine dependence, unspecified, uncomplicated: Secondary | ICD-10-CM | POA: Diagnosis not present

## 2019-09-14 DIAGNOSIS — Z79899 Other long term (current) drug therapy: Secondary | ICD-10-CM | POA: Diagnosis not present

## 2019-09-14 DIAGNOSIS — Z7984 Long term (current) use of oral hypoglycemic drugs: Secondary | ICD-10-CM | POA: Diagnosis not present

## 2019-09-14 MED ORDER — DEXAMETHASONE SODIUM PHOSPHATE 10 MG/ML IJ SOLN
INTRAMUSCULAR | Status: AC
  Filled 2019-09-14: qty 1

## 2019-09-14 MED ORDER — KETOROLAC TROMETHAMINE 60 MG/2ML IM SOLN
30.0000 mg | Freq: Once | INTRAMUSCULAR | Status: AC
  Administered 2019-09-14: 30 mg via INTRAMUSCULAR

## 2019-09-14 MED ORDER — DEXAMETHASONE SODIUM PHOSPHATE 10 MG/ML IJ SOLN
10.0000 mg | Freq: Once | INTRAMUSCULAR | Status: AC
  Administered 2019-09-14: 14:00:00 10 mg via INTRAMUSCULAR

## 2019-09-14 MED ORDER — KETOROLAC TROMETHAMINE 30 MG/ML IJ SOLN
INTRAMUSCULAR | Status: AC
  Filled 2019-09-14: qty 1

## 2019-09-14 MED ORDER — NAPROXEN 375 MG PO TABS: 375.0000 mg | ORAL_TABLET | Freq: Two times a day (BID) | ORAL | 0 refills | Status: DC

## 2019-09-14 NOTE — Discharge Instructions (Addendum)
Covid swab pending, quarantine until results return.  Typically return in approximately 2 days. We gave you toradol and decadron for your headache; should begin working in 30 to 40 minutes May continue to try naprosyn for headache at home If your headache is worsening, developing difficulty speaking, facial drooping, one-sided weakness, vision changes please follow-up in the emergency room

## 2019-09-14 NOTE — ED Triage Notes (Addendum)
Pt states having right sided headache and feel sleepy x 4 days aprox. Pt reports she  wants a Covid test.

## 2019-09-14 NOTE — ED Provider Notes (Signed)
[place]    CSN: [id number] Arrival date & time: 09/14/19  1239      History   Chief Complaint Chief Complaint  Patient presents with  . Headache  . Fatigue  . covid test    HPI Jacqueline Barnett is a 73 y.o. female history of DM type II, hypertension, hyperlipidemia presenting today for evaluation of a headache and fatigue.  Patient states that over the past 3 days she has had a headache to her right side of her head.  She has also had associated fatigue.  Describes headache as sharp.  Denies significant worsening since onset.  Denies associated vision changes.  Denies associated nausea or vomiting.  Denies photophobia.  Denies significant history of headaches.  She denies any fevers chills or body aches.  Denies any cough congestion or sore throat.  She has been taking aspirin 81 mg daily and is used this for her headache without relief.  HPI  Past Medical History:  Diagnosis Date  . Diabetes mellitus without complication ([place])   . Hypercholesteremia   . Hypertension     There are no problems to display for this patient.   Past Surgical History:  Procedure Laterality Date  . ABDOMINAL HYSTERECTOMY    . FOOT SURGERY      OB History   No obstetric history on file.      Home Medications    Prior to Admission medications   Medication Sig Start Date End Date Taking? Authorizing Provider  ACCU-CHEK SMARTVIEW test strip  08/27/19   [provider]  aspirin 81 MG chewable tablet Chew by mouth daily.    [provider]  diclofenac (VOLTAREN) 75 MG EC tablet Take 1 tablet (75 mg total) by mouth 2 (two) times daily. 02/25/17   Leandrew Koyanagi, MD  diclofenac (VOLTAREN) 75 MG EC tablet Take 1 tablet (75 mg total) by mouth 2 (two) times daily. 09/05/17   Leandrew Koyanagi, MD  diclofenac sodium (VOLTAREN) 1 % GEL Apply 2 g topically 4 (four) times daily. 09/05/17   Leandrew Koyanagi, MD  famotidine (PEPCID) 20 MG tablet Take 20 mg by mouth 2 (two)  times daily. 05/28/19   [provider]  guaiFENesin-codeine (ROBITUSSIN AC) 100-10 MG/5ML syrup Take 10 mLs by mouth 4 (four) times daily as needed for cough. 09/06/16   Billy Fischer, MD  hydrochlorothiazide (HYDRODIURIL) 25 MG tablet Take 25 mg by mouth daily.    [provider]  ipratropium (ATROVENT) 0.06 % nasal spray Place 2 sprays into both nostrils 4 (four) times daily. 09/06/16   Billy Fischer, MD  levofloxacin (LEVAQUIN) 500 MG tablet Take 1 tablet (500 mg total) by mouth daily. 09/06/16   Billy Fischer, MD  losartan (COZAAR) 50 MG tablet Take 50 mg by mouth daily.    [provider]  meloxicam (MOBIC) 7.5 MG tablet Take 1 tablet (7.5 mg total) by mouth 2 (two) times daily as needed for pain. 02/01/17   Leandrew Koyanagi, MD  METFORMIN HCL PO Take by mouth.    [provider]  naproxen (NAPROSYN) 375 MG tablet Take 1 tablet (375 mg total) by mouth 2 (two) times daily. 09/14/19   Gianlucca Szymborski C, PA-C  Omeprazole (PRILOSEC PO) Take by mouth.    [provider]  simvastatin (ZOCOR) 40 MG tablet  10/12/16   [provider]  traMADol (ULTRAM) 50 MG tablet Take 1 tablet (50 mg total) by mouth every 6 (  six) hours as needed. 03/08/16   Billy Fischer, MD    Family History History reviewed. No pertinent family history.  Social History Social History   Tobacco Use  . Smoking status: Current Every Day Smoker  . Smokeless tobacco: Never Used  Substance Use Topics  . Alcohol use: No  . Drug use: Not on file     Allergies   Patient has no known allergies.   Review of Systems Review of Systems  Constitutional: Positive for fatigue. Negative for fever.  HENT: Negative for congestion, sinus pressure and sore throat.   Eyes: Negative for photophobia, pain and visual disturbance.  Respiratory: Negative for cough and shortness of breath.   Cardiovascular: Negative for chest pain.  Gastrointestinal: Negative for abdominal pain, nausea and  vomiting.  Genitourinary: Negative for decreased urine volume and hematuria.  Musculoskeletal: Negative for myalgias, neck pain and neck stiffness.  Neurological: Positive for headaches. Negative for dizziness, syncope, facial asymmetry, speech difficulty, weakness, light-headedness and numbness.     Physical Exam Triage Vital Signs ED Triage Vitals  Enc Vitals Group     BP 09/14/19 1311 127/68     Pulse Rate 09/14/19 1311 81     Resp 09/14/19 1311 16     Temp 09/14/19 1311 98.7 F (37.1 C)     Temp Source 09/14/19 1311 Oral     SpO2 09/14/19 1311 97 %     Weight --      Height --      Head Circumference --      Peak Flow --      Pain Score 09/14/19 1309 10     Pain Loc --      Pain Edu? --      Excl. in [place]? --    No data found.  Updated Vital Signs BP 127/68 (BP Location: Left Arm)   Pulse 81   Temp 98.7 F (37.1 C) (Oral)   Resp 16   SpO2 97%   Visual Acuity Right Eye Distance:   Left Eye Distance:   Bilateral Distance:    Right Eye Near:   Left Eye Near:    Bilateral Near:     Physical Exam Vitals and nursing note reviewed.  Constitutional:      General: She is not in acute distress.    Appearance: She is well-developed.  HENT:     Head: Normocephalic and atraumatic.     Comments: Temporal nodularity or tenderness    Ears:     Comments: Bilateral ears without tenderness to palpation of external auricle, tragus and mastoid, EAC's without erythema or swelling, TM's with good bony landmarks and cone of light. Non erythematous.     Mouth/Throat:     Comments: Oral mucosa pink and moist, no tonsillar enlargement or exudate. Posterior pharynx patent and nonerythematous, no uvula deviation or swelling. Normal phonation. Palate elevates symmetrically Eyes:     Extraocular Movements: Extraocular movements intact.     Conjunctiva/sclera: Conjunctivae normal.     Pupils: Pupils are equal, round, and reactive to light.     Comments: Corneal arcus present  bilateral  Neck:     Comments: Full active range of motion of neck, no nuchal rigidity Cardiovascular:     Rate and Rhythm: Normal rate and regular rhythm.     Heart sounds: No murmur.  Pulmonary:     Effort: Pulmonary effort is normal. No respiratory distress.     Breath sounds: Normal breath sounds.  Comments: Breathing comfortably at rest, CTABL, no wheezing, rales or other adventitious sounds auscultated Abdominal:     Palpations: Abdomen is soft.     Tenderness: There is no abdominal tenderness.  Musculoskeletal:     Cervical back: Neck supple.  Skin:    General: Skin is warm and dry.     Comments: Tenderness to palpation over right parietal/infra auricular area, no overlying erythema or swelling  Neurological:     Mental Status: She is alert.     Comments: Patient A&O x3, cranial nerves II-XII grossly intact, strength at shoulders, hips and knees 5/5, equal bilaterally, patellar reflex 1+ bilaterally. Gait without abnormality.  Able to ambulate independently without abnormality from chair to exam table.       UC Treatments / Results  Labs (all labs ordered are listed, but only abnormal results are displayed) Labs Reviewed  NOVEL CORONAVIRUS, NAA (HOSP ORDER, SEND-OUT TO REF LAB; TAT 18-24 HRS)    EKG   Radiology No results found.  Procedures Procedures (including critical care time)  Medications Ordered in UC Medications  ketorolac (TORADOL) injection 30 mg (has no administration in time range)  dexamethasone (DECADRON) injection 10 mg (has no administration in time range)    Initial Impression / Assessment and Plan / UC Course  I have reviewed the triage vital signs and the nursing notes.  Pertinent labs & imaging results that were available during my care of the patient were reviewed by me and considered in my medical decision making (see chart for details).     Patient with 3 days of migraine.  No neuro deficits, vital signs stable.  Will provide  Toradol 30 mg and Decadron 10 IM today for treatment of headache.  Discussed importance of continuing to monitor headache and follow-up in ED if symptoms progressing or worsening given she does not have significant headache history.  Covid swab pending, no URI symptoms.  Naprosyn for further headache management at home.  Discussed strict return precautions. Patient verbalized understanding and is agreeable with plan.  Final Clinical Impressions(s) / UC Diagnoses   Final diagnoses:  Migraine without status migrainosus, not intractable, unspecified migraine type     Discharge Instructions     Covid swab pending, quarantine until results return.  Typically return in approximately 2 days. We gave you toradol and decadron for your headache; should begin working in 30 to 40 minutes May continue to try naprosyn for headache at home If your headache is worsening, developing difficulty speaking, facial drooping, one-sided weakness, vision changes please follow-up in the emergency room   ED Prescriptions    Medication Sig Dispense Auth. Provider   naproxen (NAPROSYN) 375 MG tablet Take 1 tablet (375 mg total) by mouth 2 (two) times daily. 20 tablet Rashida Ladouceur, [place] C, PA-C     PDMP not reviewed this encounter.   Aleeza [place], [place] C, PA-C 09/14/19 1344

## 2019-09-16 LAB — NOVEL CORONAVIRUS, NAA (HOSP ORDER, SEND-OUT TO REF LAB; TAT 18-24 HRS): SARS-CoV-2, NAA: DETECTED — AB

## 2019-09-17 ENCOUNTER — Telehealth: Payer: Self-pay | Admitting: Nurse Practitioner

## 2019-09-17 NOTE — Telephone Encounter (Signed)
Called to Discuss with patient about Covid symptoms and the use of bamlanivimab, a monoclonal antibody infusion for those with mild to moderate Covid symptoms and at a high risk of hospitalization.     Pt is qualified for this infusion at the [facility] infusion center due to co-morbid conditions and/or a member of an at-risk group.     Patient is managed for the following: Hypertension diabetes  Unable to reach pt

## 2019-09-18 ENCOUNTER — Telehealth (HOSPITAL_COMMUNITY): Payer: Self-pay | Admitting: Emergency Medicine

## 2019-09-18 ENCOUNTER — Inpatient Hospital Stay (HOSPITAL_COMMUNITY)
Admission: EM | Admit: 2019-09-18 | Discharge: 2019-09-24 | DRG: 177 | Disposition: A | Discharge status: Home / Self Care | Payer: Medicare HMO | Attending: Internal Medicine | Admitting: Internal Medicine

## 2019-09-18 ENCOUNTER — Other Ambulatory Visit: Payer: Self-pay

## 2019-09-18 ENCOUNTER — Emergency Department (HOSPITAL_COMMUNITY): Payer: Medicare HMO

## 2019-09-18 ENCOUNTER — Encounter (HOSPITAL_COMMUNITY): Payer: Self-pay

## 2019-09-18 DIAGNOSIS — E1122 Type 2 diabetes mellitus with diabetic chronic kidney disease: Secondary | ICD-10-CM | POA: Diagnosis present

## 2019-09-18 DIAGNOSIS — E119 Type 2 diabetes mellitus without complications: Secondary | ICD-10-CM

## 2019-09-18 DIAGNOSIS — N1832 Chronic kidney disease, stage 3b: Secondary | ICD-10-CM | POA: Diagnosis present

## 2019-09-18 DIAGNOSIS — E7800 Pure hypercholesterolemia, unspecified: Secondary | ICD-10-CM | POA: Diagnosis present

## 2019-09-18 DIAGNOSIS — N179 Acute kidney failure, unspecified: Secondary | ICD-10-CM | POA: Diagnosis present

## 2019-09-18 DIAGNOSIS — Z791 Long term (current) use of non-steroidal anti-inflammatories (NSAID): Secondary | ICD-10-CM | POA: Diagnosis not present

## 2019-09-18 DIAGNOSIS — R918 Other nonspecific abnormal finding of lung field: Secondary | ICD-10-CM | POA: Diagnosis not present

## 2019-09-18 DIAGNOSIS — E1165 Type 2 diabetes mellitus with hyperglycemia: Secondary | ICD-10-CM

## 2019-09-18 DIAGNOSIS — F17200 Nicotine dependence, unspecified, uncomplicated: Secondary | ICD-10-CM | POA: Diagnosis present

## 2019-09-18 DIAGNOSIS — E785 Hyperlipidemia, unspecified: Secondary | ICD-10-CM | POA: Diagnosis present

## 2019-09-18 DIAGNOSIS — I129 Hypertensive chronic kidney disease with stage 1 through stage 4 chronic kidney disease, or unspecified chronic kidney disease: Secondary | ICD-10-CM | POA: Diagnosis present

## 2019-09-18 DIAGNOSIS — J440 Chronic obstructive pulmonary disease with acute lower respiratory infection: Secondary | ICD-10-CM | POA: Diagnosis present

## 2019-09-18 DIAGNOSIS — R778 Other specified abnormalities of plasma proteins: Secondary | ICD-10-CM | POA: Diagnosis not present

## 2019-09-18 DIAGNOSIS — I10 Essential (primary) hypertension: Secondary | ICD-10-CM | POA: Diagnosis not present

## 2019-09-18 DIAGNOSIS — Z20828 Contact with and (suspected) exposure to other viral communicable diseases: Secondary | ICD-10-CM | POA: Diagnosis not present

## 2019-09-18 DIAGNOSIS — J1282 Pneumonia due to coronavirus disease 2019: Secondary | ICD-10-CM | POA: Diagnosis present

## 2019-09-18 DIAGNOSIS — R9431 Abnormal electrocardiogram [ECG] [EKG]: Secondary | ICD-10-CM | POA: Diagnosis present

## 2019-09-18 DIAGNOSIS — J1289 Other viral pneumonia: Secondary | ICD-10-CM | POA: Diagnosis present

## 2019-09-18 DIAGNOSIS — U071 COVID-19: Secondary | ICD-10-CM | POA: Diagnosis present

## 2019-09-18 DIAGNOSIS — Z7984 Long term (current) use of oral hypoglycemic drugs: Secondary | ICD-10-CM | POA: Diagnosis not present

## 2019-09-18 DIAGNOSIS — J9601 Acute respiratory failure with hypoxia: Secondary | ICD-10-CM | POA: Diagnosis present

## 2019-09-18 DIAGNOSIS — R531 Weakness: Secondary | ICD-10-CM | POA: Diagnosis not present

## 2019-09-18 DIAGNOSIS — Z7982 Long term (current) use of aspirin: Secondary | ICD-10-CM

## 2019-09-18 LAB — CBC WITH DIFFERENTIAL/PLATELET
Abs Immature Granulocytes: 0.05 10*3/uL (ref 0.00–0.07)
Basophils Absolute: 0 10*3/uL (ref 0.0–0.1)
Basophils Relative: 0 %
Eosinophils Absolute: 0 10*3/uL (ref 0.0–0.5)
Eosinophils Relative: 0 %
HCT: 37.4 % (ref 36.0–46.0)
Hemoglobin: 12.3 g/dL (ref 12.0–15.0)
Immature Granulocytes: 1 %
Lymphocytes Relative: 8 %
Lymphs Abs: 0.5 10*3/uL — ABNORMAL LOW (ref 0.7–4.0)
MCH: 29.2 pg (ref 26.0–34.0)
MCHC: 32.9 g/dL (ref 30.0–36.0)
MCV: 88.8 fL (ref 80.0–100.0)
Monocytes Absolute: 0.1 10*3/uL (ref 0.1–1.0)
Monocytes Relative: 2 %
Neutro Abs: 5.7 10*3/uL (ref 1.7–7.7)
Neutrophils Relative %: 89 %
Platelets: 340 10*3/uL (ref 150–400)
RBC: 4.21 MIL/uL (ref 3.87–5.11)
RDW: 12.9 % (ref 11.5–15.5)
WBC: 6.5 10*3/uL (ref 4.0–10.5)
nRBC: 0 % (ref 0.0–0.2)

## 2019-09-18 LAB — COMPREHENSIVE METABOLIC PANEL
ALT: 17 U/L (ref 0–44)
AST: 35 U/L (ref 15–41)
Albumin: 3.2 g/dL — ABNORMAL LOW (ref 3.5–5.0)
Alkaline Phosphatase: 63 U/L (ref 38–126)
Anion gap: 16 — ABNORMAL HIGH (ref 5–15)
BUN: 36 mg/dL — ABNORMAL HIGH (ref 8–23)
CO2: 27 mmol/L (ref 22–32)
Calcium: 9.2 mg/dL (ref 8.9–10.3)
Chloride: 95 mmol/L — ABNORMAL LOW (ref 98–111)
Creatinine, Ser: 1.37 mg/dL — ABNORMAL HIGH (ref 0.44–1.00)
GFR calc Af Amer: 44 mL/min — ABNORMAL LOW (ref >60)
GFR calc non Af Amer: 38 mL/min — ABNORMAL LOW (ref >60)
Glucose, Bld: 250 mg/dL — ABNORMAL HIGH (ref 70–99)
Potassium: 4.3 mmol/L (ref 3.5–5.1)
Sodium: 138 mmol/L (ref 135–145)
Total Bilirubin: 0.5 mg/dL (ref 0.3–1.2)
Total Protein: 7.7 g/dL (ref 6.5–8.1)

## 2019-09-18 LAB — TRIGLYCERIDES: Triglycerides: 202 mg/dL — ABNORMAL HIGH (ref <150)

## 2019-09-18 LAB — HEMOGLOBIN A1C
Hgb A1c MFr Bld: 8.2 % — ABNORMAL HIGH (ref 4.8–5.6)
Mean Plasma Glucose: 188.64 mg/dL

## 2019-09-18 LAB — CBG MONITORING, ED
Glucose-Capillary: 196 mg/dL — ABNORMAL HIGH (ref 70–99)
Glucose-Capillary: 225 mg/dL — ABNORMAL HIGH (ref 70–99)

## 2019-09-18 LAB — FERRITIN: Ferritin: 541 ng/mL — ABNORMAL HIGH (ref 11–307)

## 2019-09-18 LAB — D-DIMER, QUANTITATIVE: D-Dimer, Quant: 1.54 ug/mL-FEU — ABNORMAL HIGH (ref 0.00–0.50)

## 2019-09-18 LAB — LACTIC ACID, PLASMA: Lactic Acid, Venous: 1.8 mmol/L (ref 0.5–1.9)

## 2019-09-18 LAB — TROPONIN I (HIGH SENSITIVITY)
Troponin I (High Sensitivity): 24 ng/L — ABNORMAL HIGH (ref <18)
Troponin I (High Sensitivity): 27 ng/L — ABNORMAL HIGH (ref <18)

## 2019-09-18 LAB — LACTATE DEHYDROGENASE: LDH: 340 U/L — ABNORMAL HIGH (ref 98–192)

## 2019-09-18 LAB — PROCALCITONIN: Procalcitonin: 0.19 ng/mL

## 2019-09-18 LAB — FIBRINOGEN: Fibrinogen: >800 mg/dL — ABNORMAL HIGH (ref 210–475)

## 2019-09-18 LAB — C-REACTIVE PROTEIN: CRP: 32.6 mg/dL — ABNORMAL HIGH (ref <1.0)

## 2019-09-18 MED ORDER — SODIUM CHLORIDE 0.9 % IV SOLN
100.0000 mg | Freq: Every day | INTRAVENOUS | Status: AC
  Administered 2019-09-19 – 2019-09-22 (×4): 100 mg via INTRAVENOUS
  Filled 2019-09-18: qty 20
  Filled 2019-09-18: qty 100
  Filled 2019-09-18 (×3): qty 20

## 2019-09-18 MED ORDER — INSULIN ASPART 100 UNIT/ML ~~LOC~~ SOLN
0.0000 [IU] | SUBCUTANEOUS | Status: DC
  Administered 2019-09-18: 2 [IU] via SUBCUTANEOUS
  Administered 2019-09-19 (×3): 3 [IU] via SUBCUTANEOUS
  Administered 2019-09-19: 2 [IU] via SUBCUTANEOUS
  Administered 2019-09-19 (×2): 3 [IU] via SUBCUTANEOUS
  Administered 2019-09-20: 2 [IU] via SUBCUTANEOUS
  Administered 2019-09-20 (×2): 3 [IU] via SUBCUTANEOUS
  Administered 2019-09-20: 5 [IU] via SUBCUTANEOUS
  Administered 2019-09-20 – 2019-09-21 (×2): 3 [IU] via SUBCUTANEOUS
  Administered 2019-09-21 – 2019-09-22 (×5): 2 [IU] via SUBCUTANEOUS
  Administered 2019-09-22: 3 [IU] via SUBCUTANEOUS
  Administered 2019-09-22 (×2): 1 [IU] via SUBCUTANEOUS
  Administered 2019-09-22: 2 [IU] via SUBCUTANEOUS
  Administered 2019-09-23: 3 [IU] via SUBCUTANEOUS
  Administered 2019-09-23: 2 [IU] via SUBCUTANEOUS
  Administered 2019-09-23: 3 [IU] via SUBCUTANEOUS
  Administered 2019-09-23 – 2019-09-24 (×3): 2 [IU] via SUBCUTANEOUS
  Administered 2019-09-24: 3 [IU] via SUBCUTANEOUS

## 2019-09-18 MED ORDER — ENOXAPARIN SODIUM 40 MG/0.4ML ~~LOC~~ SOLN
40.0000 mg | SUBCUTANEOUS | Status: DC
  Administered 2019-09-18: 40 mg via SUBCUTANEOUS
  Filled 2019-09-18: qty 0.4

## 2019-09-18 MED ORDER — PANTOPRAZOLE SODIUM 40 MG PO TBEC
40.0000 mg | DELAYED_RELEASE_TABLET | Freq: Every day | ORAL | Status: DC
  Administered 2019-09-18 – 2019-09-24 (×7): 40 mg via ORAL
  Filled 2019-09-18 (×7): qty 1

## 2019-09-18 MED ORDER — SODIUM CHLORIDE 0.9% FLUSH
3.0000 mL | Freq: Two times a day (BID) | INTRAVENOUS | Status: DC
  Administered 2019-09-18 – 2019-09-23 (×8): 3 mL via INTRAVENOUS

## 2019-09-18 MED ORDER — ACETAMINOPHEN 325 MG PO TABS: 650.0000 mg | ORAL_TABLET | Freq: Four times a day (QID) | ORAL | Status: DC | PRN

## 2019-09-18 MED ORDER — ALBUTEROL SULFATE HFA 108 (90 BASE) MCG/ACT IN AERS
2.0000 | INHALATION_SPRAY | Freq: Once | RESPIRATORY_TRACT | Status: AC
  Administered 2019-09-18: 2 via RESPIRATORY_TRACT
  Filled 2019-09-18: qty 6.7

## 2019-09-18 MED ORDER — ASPIRIN 81 MG PO CHEW
81.0000 mg | CHEWABLE_TABLET | Freq: Every day | ORAL | Status: DC
  Administered 2019-09-19 – 2019-09-24 (×6): 81 mg via ORAL
  Filled 2019-09-18 (×6): qty 1

## 2019-09-18 MED ORDER — ASPIRIN 81 MG PO CHEW
324.0000 mg | CHEWABLE_TABLET | Freq: Once | ORAL | Status: AC
  Administered 2019-09-18: 324 mg via ORAL
  Filled 2019-09-18: qty 4

## 2019-09-18 MED ORDER — ONDANSETRON HCL 4 MG PO TABS: 4.0000 mg | ORAL_TABLET | Freq: Four times a day (QID) | ORAL | Status: DC | PRN

## 2019-09-18 MED ORDER — TRAMADOL HCL 50 MG PO TABS: 50.0000 mg | ORAL_TABLET | Freq: Four times a day (QID) | ORAL | Status: DC | PRN

## 2019-09-18 MED ORDER — SODIUM CHLORIDE 0.9% FLUSH: 3.0000 mL | INTRAVENOUS | Status: DC | PRN

## 2019-09-18 MED ORDER — DEXAMETHASONE SODIUM PHOSPHATE 10 MG/ML IJ SOLN
6.0000 mg | Freq: Once | INTRAMUSCULAR | Status: AC
  Administered 2019-09-18: 6 mg via INTRAVENOUS
  Filled 2019-09-18: qty 1

## 2019-09-18 MED ORDER — DEXAMETHASONE SODIUM PHOSPHATE 10 MG/ML IJ SOLN
6.0000 mg | INTRAMUSCULAR | Status: DC
  Administered 2019-09-19 – 2019-09-23 (×5): 6 mg via INTRAVENOUS
  Filled 2019-09-18 (×5): qty 1

## 2019-09-18 MED ORDER — SODIUM CHLORIDE 0.9% FLUSH
3.0000 mL | Freq: Two times a day (BID) | INTRAVENOUS | Status: DC
  Administered 2019-09-18 – 2019-09-24 (×11): 3 mL via INTRAVENOUS

## 2019-09-18 MED ORDER — SODIUM CHLORIDE 0.9 % IV SOLN
200.0000 mg | Freq: Once | INTRAVENOUS | Status: AC
  Administered 2019-09-18: 200 mg via INTRAVENOUS
  Filled 2019-09-18: qty 40

## 2019-09-18 MED ORDER — LOSARTAN POTASSIUM 50 MG PO TABS
50.0000 mg | ORAL_TABLET | Freq: Every day | ORAL | Status: DC
  Administered 2019-09-18 – 2019-09-24 (×7): 50 mg via ORAL
  Filled 2019-09-18 (×7): qty 1

## 2019-09-18 MED ORDER — HYDROCODONE-ACETAMINOPHEN 5-325 MG PO TABS: 1.0000 | ORAL_TABLET | ORAL | Status: DC | PRN

## 2019-09-18 MED ORDER — SIMVASTATIN 20 MG PO TABS
40.0000 mg | ORAL_TABLET | Freq: Every day | ORAL | Status: DC
  Administered 2019-09-19 – 2019-09-23 (×5): 40 mg via ORAL
  Filled 2019-09-18 (×5): qty 2

## 2019-09-18 MED ORDER — ASPIRIN 81 MG PO CHEW: 81.0000 mg | CHEWABLE_TABLET | Freq: Every day | ORAL | Status: DC

## 2019-09-18 MED ORDER — SODIUM CHLORIDE 0.9 % IV SOLN: 250.0000 mL | INTRAVENOUS | Status: DC | PRN

## 2019-09-18 MED ORDER — ONDANSETRON HCL 4 MG/2ML IJ SOLN: 4.0000 mg | Freq: Four times a day (QID) | INTRAMUSCULAR | Status: DC | PRN

## 2019-09-18 MED ORDER — ACETAMINOPHEN 325 MG PO TABS
650.0000 mg | ORAL_TABLET | Freq: Once | ORAL | Status: AC
  Administered 2019-09-18: 650 mg via ORAL
  Filled 2019-09-18: qty 2

## 2019-09-18 MED ORDER — GUAIFENESIN-DM 100-10 MG/5ML PO SYRP: 10.0000 mL | ORAL_SOLUTION | ORAL | Status: DC | PRN

## 2019-09-18 NOTE — Progress Notes (Signed)
Cardiology [place] Note  Paged by ED provider for abnormal ECG. Patient with known [place] presenting with mild hypoxemia. Minimally symptomatic. No known history of cardiovascular disease. Patient denies chest pain, chest pressure, syncope, presyncope, palpitations, dizziness, lightheadedness.   Vital signs on presentation significant for tachypnea (35) and mild hypoxemia requiring 2L oxygen by nasal canula. Otherwise normal. Labs with Cr 1.37, troponin 24 then 27 2 hours later. Lactate normal. CBC normal.   ECG with sinus rhythm, normal axis, prolonged QT, deep T wave inversions throughout the precordium and limb leads. No prior ECG for comparison.   Cardiology consulted for abnormal ECG.   Patient's ECG suggestive of diffuse ischemia, though she is currently asymptomatic from a cardiac standpoint. Her risk factors for coronary disease include age, smoking, HTN, HLD, DM2. 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 Acute PE is always possible in patient with COVID but would not likely cause this pattern of ECG abnormalities. Overall it is most likely that her ECG abnormalities are caused by her active COVID infection as she does not report any symptoms suggestive of ACS. Fortunately her troponin is only slightly elevated with no significant delta over 2 hours. See below for recommendations.   Summary of recommendations: - check lipids, A1c - give aspirin 325mg  x1 - start aspirin 81mg  daily - start heparin drip for goal PTT 50 to 70 - order routine echocardiogram - monitor on telemetry - close monitoring for chest discomfort - cardiology will continue to follow - please reach out to on call cardiology pager overnight with any questions  Marcie Mowers, MD Cardiology Fellow, PGY-7

## 2019-09-18 NOTE — ED Provider Notes (Signed)
[place] EMERGENCY DEPARTMENT Provider Note   CSN: [id number] Arrival date & time: 09/18/19  1526     History Chief Complaint  Patient presents with  . Weakness  . COVID +    Jacqueline Barnett is a 73 y.o. female.  HPI   Patient is a 73 year old female with a history of diabetes, hyperlipidemia, hypertension, who presents to the emergency department today for evaluation of generalized weakness.  Patient was diagnosed with Covid earlier this week.  She was seen at urgent care 09/14/2019 for evaluation of headache and fatigue.  Was tested at that time and symptoms were positive.  She was contacted by [facility] to be informed that her tests were positive and per RNs note, patient's daughter stated that she had been doing very poorly, had not been eating or drinking, was very fatigued and sleeping a lot and very weak.  She was then advised to come to the ER.  The patient denies any cough, fevers, shortness of breath, chest pain, vomiting, diarrhea.  She does report generalized weakness.  She states that she had been having a headache earlier this week and was given naproxen which has been improving her symptoms.  Past Medical History:  Diagnosis Date  . Diabetes mellitus without complication ([place])   . Hypercholesteremia   . Hypertension     Patient Active Problem List   Diagnosis Date Noted  . Pneumonia due to COVID-19 virus 09/18/2019  . DM2 (diabetes mellitus, type 2) ([place]) 09/18/2019  . Abnormal ECG 09/18/2019  . HTN (hypertension) 09/18/2019  . Elevated troponin 09/18/2019    Past Surgical History:  Procedure Laterality Date  . ABDOMINAL HYSTERECTOMY    . FOOT SURGERY       OB History   No obstetric history on file.     No family history on file.  Social History   Tobacco Use  . Smoking status: Current Every Day Smoker  . Smokeless tobacco: Never Used  Substance Use Topics  . Alcohol use: No  . Drug use: Not on file    Home  Medications Prior to Admission medications   Medication Sig Start Date End Date Taking? Authorizing Provider  ACCU-CHEK SMARTVIEW test strip  08/27/19   [provider]  aspirin 81 MG chewable tablet Chew by mouth daily.    [provider]  diclofenac (VOLTAREN) 75 MG EC tablet Take 1 tablet (75 mg total) by mouth 2 (two) times daily. 02/25/17   Leandrew Koyanagi, MD  diclofenac (VOLTAREN) 75 MG EC tablet Take 1 tablet (75 mg total) by mouth 2 (two) times daily. 09/05/17   Leandrew Koyanagi, MD  diclofenac sodium (VOLTAREN) 1 % GEL Apply 2 g topically 4 (four) times daily. 09/05/17   Leandrew Koyanagi, MD  famotidine (PEPCID) 20 MG tablet Take 20 mg by mouth 2 (two) times daily. 05/28/19   [provider]  guaiFENesin-codeine (ROBITUSSIN AC) 100-10 MG/5ML syrup Take 10 mLs by mouth 4 (four) times daily as needed for cough. 09/06/16   Billy Fischer, MD  hydrochlorothiazide (HYDRODIURIL) 25 MG tablet Take 25 mg by mouth daily.    [provider]  ipratropium (ATROVENT) 0.06 % nasal spray Place 2 sprays into both nostrils 4 (four) times daily. 09/06/16   Billy Fischer, MD  levofloxacin (LEVAQUIN) 500 MG tablet Take 1 tablet (500 mg total) by mouth daily. 09/06/16   Billy Fischer, MD  losartan (COZAAR) 50 MG tablet Take 50 mg by  mouth daily.    [provider]  meloxicam (MOBIC) 7.5 MG tablet Take 1 tablet (7.5 mg total) by mouth 2 (two) times daily as needed for pain. 02/01/17   Leandrew Koyanagi, MD  METFORMIN HCL PO Take by mouth.    [provider]  naproxen (NAPROSYN) 375 MG tablet Take 1 tablet (375 mg total) by mouth 2 (two) times daily. 09/14/19   Wieters, Hallie C, PA-C  Omeprazole (PRILOSEC PO) Take by mouth.    [provider]  simvastatin (ZOCOR) 40 MG tablet  10/12/16   [provider]  traMADol (ULTRAM) 50 MG tablet Take 1 tablet (50 mg total) by mouth every 6 (six) hours as needed. 03/08/16   Billy Fischer, MD    Allergies    Patient  has no known allergies.  Review of Systems   Review of Systems  Constitutional: Negative for fever.  HENT: Negative for ear pain and sore throat.   Eyes: Negative for visual disturbance.  Respiratory: Negative for cough and shortness of breath.   Cardiovascular: Negative for chest pain.  Gastrointestinal: Negative for abdominal pain, constipation, diarrhea, nausea and vomiting.  Genitourinary: Negative for dysuria and hematuria.  Musculoskeletal: Negative for back pain.  Skin: Negative for rash.  Neurological: Positive for weakness and headaches. Negative for dizziness, light-headedness and numbness.  All other systems reviewed and are negative.   Physical Exam Updated Vital Signs BP 111/72   Pulse 77   Temp 99.1 F (37.3 C)   Resp (!) 28   Ht 5\' 3"  (1.6 m)   Wt 72.6 kg   SpO2 94%   BMI 28.34 kg/m   Physical Exam Vitals and nursing note reviewed.  Constitutional:      General: She is not in acute distress.    Appearance: She is well-developed.  HENT:     Head: Normocephalic and atraumatic.  Eyes:     Conjunctiva/sclera: Conjunctivae normal.  Cardiovascular:     Rate and Rhythm: Normal rate and regular rhythm.     Heart sounds: Normal heart sounds. No murmur.  Pulmonary:     Effort: Pulmonary effort is normal. No respiratory distress.     Breath sounds: Examination of the right-middle field reveals rales. Examination of the left-middle field reveals rales. Examination of the right-lower field reveals rales. Examination of the left-lower field reveals rales. Rales present.  Abdominal:     General: Bowel sounds are normal.     Palpations: Abdomen is soft.     Tenderness: There is no abdominal tenderness. There is no rebound.  Musculoskeletal:     Cervical back: Neck supple.  Skin:    General: Skin is warm and dry.  Neurological:     Mental Status: She is alert.     Comments: Mental Status:  Alert, thought content appropriate. Speech fluent without evidence of  aphasia. Able to follow 2 step commands without difficulty.  Cranial Nerves:  II: pupils equal, round, reactive to light III,IV, VI: ptosis not present, extra-ocular motions intact bilaterally  V,VII: smile symmetric, facial light touch sensation equal VIII: hearing grossly normal to voice  X: uvula elevates symmetrically  XI: bilateral shoulder shrug symmetric and strong XII: midline tongue extension without fassiculations Motor:  Normal tone. 5/5 strength of BUE and BLE major muscle groups including strong and equal grip strength and dorsiflexion/plantar flexion Sensory: light touch normal in all extremities.      ED Results / Procedures / Treatments   Labs (all labs ordered are  listed, but only abnormal results are displayed) Labs Reviewed  CBC WITH DIFFERENTIAL/PLATELET - Abnormal; Notable for the following components:      Result Value   Lymphs Abs 0.5 (*)    All other components within normal limits  COMPREHENSIVE METABOLIC PANEL - Abnormal; Notable for the following components:   Chloride 95 (*)    Glucose, Bld 250 (*)    BUN 36 (*)    Creatinine, Ser 1.37 (*)    Albumin 3.2 (*)    GFR calc non Af Amer 38 (*)    GFR calc Af Amer 44 (*)    Anion gap 16 (*)    All other components within normal limits  D-DIMER, QUANTITATIVE (NOT AT [facility]) - Abnormal; Notable for the following components:   D-Dimer, Quant 1.54 (*)    All other components within normal limits  LACTATE DEHYDROGENASE - Abnormal; Notable for the following components:   LDH 340 (*)    All other components within normal limits  FERRITIN - Abnormal; Notable for the following components:   Ferritin 541 (*)    All other components within normal limits  TRIGLYCERIDES - Abnormal; Notable for the following components:   Triglycerides 202 (*)    All other components within normal limits  FIBRINOGEN - Abnormal; Notable for the following components:   Fibrinogen >800 (*)    All other components within normal  limits  C-REACTIVE PROTEIN - Abnormal; Notable for the following components:   CRP 32.6 (*)    All other components within normal limits  TROPONIN I (HIGH SENSITIVITY) - Abnormal; Notable for the following components:   Troponin I (High Sensitivity) 24 (*)    All other components within normal limits  CULTURE, BLOOD (ROUTINE X 2)  CULTURE, BLOOD (ROUTINE X 2)  LACTIC ACID, PLASMA  PROCALCITONIN  HEMOGLOBIN A1C  CBC WITH DIFFERENTIAL/PLATELET  COMPREHENSIVE METABOLIC PANEL  C-REACTIVE PROTEIN  D-DIMER, QUANTITATIVE (NOT AT [facility])  FERRITIN  MAGNESIUM  ABO/RH  TROPONIN I (HIGH SENSITIVITY)    EKG EKG Interpretation  Date/Time:  Tuesday September 18 2019 16:41:31 EST Ventricular Rate:  87 PR Interval:    QRS Duration: 87 QT Interval:  424 QTC Calculation: 511 R Axis:   22 Text Interpretation: Sinus rhythm Atrial premature complexes Abnormal R-wave progression, early transition Abnormal T, probable ischemia, widespread Minimal ST elevation, inferior leads Prolonged QT interval [place]? Reconfirmed by Davonna Belling [phone]) on 09/18/2019 7:49:55 PM   Radiology DG Chest Port 1 View  Result Date: 09/18/2019 CLINICAL DATA:  Hypoxia.  Weakness. EXAM: PORTABLE CHEST 1 VIEW COMPARISON:  09/06/2016. FINDINGS: There are hazy bilateral peripheral airspace lung opacities, new since the prior exam. No pleural effusion or pneumothorax. Cardiac silhouette is top in size. Aorta is tortuous. No mediastinal or hilar masses. Skeletal structures are grossly intact. IMPRESSION: 1. Hazy bilateral peripheral airspace lung opacities consistent with multifocal pneumonia. The pattern is suspicious for COVID-19 infection Electronically Signed   By: Lajean Manes M.D.   On: 09/18/2019 17:24    Procedures Procedures (including critical care time) CRITICAL CARE Performed by: Rodney Booze   Total critical care time: 31 minutes  Critical care time was exclusive of separately billable procedures  and treating other patients.  Critical care was necessary to treat or prevent imminent or life-threatening deterioration.  Critical care was time spent personally by me on the following activities: development of treatment plan with patient and/or surrogate as well as nursing, discussions with consultants, evaluation of patient's response to treatment,  examination of patient, obtaining history from patient or surrogate, ordering and performing treatments and interventions, ordering and review of laboratory studies, ordering and review of radiographic studies, pulse oximetry and re-evaluation of patient's condition.   Medications Ordered in ED Medications  aspirin chewable tablet 81 mg (has no administration in time range)  traMADol (ULTRAM) tablet 50 mg (has no administration in time range)  losartan (COZAAR) tablet 50 mg (has no administration in time range)  simvastatin (ZOCOR) tablet 40 mg (has no administration in time range)  pantoprazole (PROTONIX) EC tablet 40 mg (has no administration in time range)  enoxaparin (LOVENOX) injection 40 mg (has no administration in time range)  sodium chloride flush (NS) 0.9 % injection 3 mL (has no administration in time range)  sodium chloride flush (NS) 0.9 % injection 3 mL (has no administration in time range)  sodium chloride flush (NS) 0.9 % injection 3 mL (has no administration in time range)  0.9 %  sodium chloride infusion (has no administration in time range)  guaiFENesin-dextromethorphan (ROBITUSSIN DM) 100-10 MG/5ML syrup 10 mL (has no administration in time range)  acetaminophen (TYLENOL) tablet 650 mg (has no administration in time range)  HYDROcodone-acetaminophen (NORCO/VICODIN) 5-325 MG per tablet 1-2 tablet (has no administration in time range)  ondansetron (ZOFRAN) tablet 4 mg (has no administration in time range)    Or  ondansetron (ZOFRAN) injection 4 mg (has no administration in time range)  insulin aspart (novoLOG) injection 0-9  Units (has no administration in time range)  dexamethasone (DECADRON) injection 6 mg (has no administration in time range)  remdesivir 200 mg in sodium chloride 0.9% 250 mL IVPB (has no administration in time range)    Followed by  remdesivir 100 mg in sodium chloride 0.9 % 100 mL IVPB (has no administration in time range)  albuterol (VENTOLIN HFA) 108 (90 Base) MCG/ACT inhaler 2 puff (2 puffs Inhalation Given 09/18/19 1709)  dexamethasone (DECADRON) injection 6 mg (6 mg Intravenous Given 09/18/19 1829)  acetaminophen (TYLENOL) tablet 650 mg (650 mg Oral Given 09/18/19 1828)  aspirin chewable tablet 324 mg (324 mg Oral Given 09/18/19 1943)    ED Course  I have reviewed the triage vital signs and the nursing notes.  Pertinent labs & imaging results that were available during my care of the patient were reviewed by me and considered in my medical decision making (see chart for details).    MDM Rules/Calculators/A&P                     73 year old female presenting for evaluation of generalized weakness.  Diagnosed with Covid several days ago.  On arrival found to be hypoxic in the upper 80s.  The remainder of her vital signs are reassuring.  Sats improved to 95% on 2 L.  CBC wnl CMP with hyperglycemia, elevated BUN/Cr (no prior for comparison, suspect prerenal in setting of decrease PO intake, PO fluids given in ED), LFTs wnl, mildly elevated anion gap Lactic acid wnl Trop is marginally elevated, given asa, likely 2/2 demand, will trend Procalcitonin marginally elevated  Inflammatory markers elevated  EKG Sinus rhythm Atrial premature complexes Abnormal R-wave progression, early transition Abnormal T, probable ischemia, widespread Minimal ST elevation, inferior leads Prolonged QT interval  CXR with hazy bilateral peripheral airspace lung opacities consistent with multifocal pneumonia. The pattern is suspicious for COVID-19 infection  Pt placed on 2L [place] and has been maintaining sats.  She was given albuterol and decadron in the ED.   7:50  PM CONSULT with Dr. Peri Jefferson with hospitalist service who accepts patient for admission. She requests cardiology consult due to abnormal EKG.  8:18 PM CONSULT with Dr. Bari Edward with cardiology who will consult on the patient.   Final Clinical Impression(s) / ED Diagnoses Final diagnoses:  None    Rx / DC Orders ED Discharge Orders    None       Bishop Dublin 09/18/19 2023    Davonna Belling, MD 09/18/19 2200

## 2019-09-18 NOTE — ED Triage Notes (Signed)
Pt reports generalized weakness, sent from UC due to positive COVID test, pt 88% on room air, 2L [place] applied, saturations increased to 95%. Pt denies CP or SOB at this time. resp e.u

## 2019-09-18 NOTE — Telephone Encounter (Signed)
Your test for COVID-19 was positive, meaning that you were infected with the novel coronavirus and could give the germ to others.  Please continue isolation at home for at least 10 days since the start of your symptoms. If you do not have symptoms, please isolate at home for 10 days from the day you were tested. Once you complete your 10 day quarantine, you may return to normal activities as long as you've not had a fever for over 24 hours(without taking fever reducing medicine) and your symptoms are improving. Please continue good preventive care measures, including:  frequent hand-washing, avoid touching your face, cover coughs/sneezes, stay out of crowds and keep a 6 foot distance from others.  Go to the nearest hospital emergency room if fever/cough/breathlessness are severe or illness seems like a threat to life.  Contacted patient and daughter. Daughter states pt has been doing very poorly, not eating or drinking, and sleeping a lot, very weak. Discussed with daughter that pt needs to be evaluated in the ER due to significant worsening of symptoms. Daughter verbalized understanding, will take her to [facility] ER.

## 2019-09-18 NOTE — ED Notes (Signed)
Pt on 2L 02 per [place]

## 2019-09-18 NOTE — ED Notes (Addendum)
Updated daughter on pt's condition. Daughter wants called when MD decides if pt is staying here or going to [facility]. Daughter's name is  Caleen Essex [phone]

## 2019-09-18 NOTE — H&P (Signed)
Jacqueline Barnett [id number] DOB: 08/14/1946 DOA: 09/18/2019     PCP: Wenda Low, MD   Outpatient Specialists:  NONE    Patient arrived to ER on 09/18/19 at 1526  Patient coming from: home Lives   With family    Chief Complaint:   Chief Complaint  Patient presents with  . Weakness  . COVID +    HPI: Jacqueline Barnett is a 73 y.o. female with medical history significant of known Covid infection diabetes type 2, HTN, COPD, HLD    Presented with   deterioration of her Covid symptoms.  Patient originally was evaluated on 11 December at urgent care with headache and feeling lethargic.  She has not been feeling well for the past 1 week or so, multiple family members have been ill at home with severe symptoms She was tested on 11 of last December and was found to be positive for Covid.  When patient was contacted her family stated that she was getting progressively worse    Infectious risk factors:  Reports Body aches, severe fatigue  KNOWN COVID POSITIVE   In  ER RAPID COVID TEST   POSITIVE,     Lab Results  Component Value Date   [place] (A) 09/14/2019     Regarding pertinent Chronic problems:      Hyperlipidemia - on statins Zocor   HTN on HCTZ, cozaar   DM 2 - PO meds only       While in ER: Was found to have somewhat elevated troponin 24-27-32 And abnormal EKG with diffuse T wave inversions Mildly hypoxic requiring 2 L of nasal cannula and tachypneic up to 32  ER Provider Called:    Cardiology fellow Dr. Neena Rhymes  They Recommend admit to medicine   Will see     in ER Felt that abnormal EKG was likely Covid related recommended aspirin 325 started heparin drip echogram in the morning  The following Work up has been ordered so far:  Orders Placed This Encounter  Procedures  . Blood Culture (routine x 2)  . DG Chest Port 1 View  . CBC WITH DIFFERENTIAL  . Comprehensive metabolic panel  . D-dimer, quantitative  .  Procalcitonin  . Lactate dehydrogenase  . Ferritin  . Triglycerides  . Fibrinogen  . C-reactive protein  . Cardiac monitoring  . Insert peripheral IV x 2  . Initiate Carrier Fluid Protocol  . Place surgical mask on patient  . Patient to wear surgical mask during transportation  . Assess patient for ability to self-prone. If able (can move self in bed, ambulate) and stable (SpO2 and oxygen requirement):  . RN/NT - Document specific oxygen requirements in CHL  . Notify EDP if new oxygen requirements escalates > 4L per minute [place]  . RN to draw the following extra tubes:  . Consult to hospitalist  ALL PATIENTS BEING ADMITTED/HAVING PROCEDURES NEED COVID-19 SCREENING  . Inpatient consult to Cardiology  ALL PATIENTS BEING ADMITTED/HAVING PROCEDURES NEED COVID-19 SCREENING  . Airborne and Contact precautions  . Pulse oximetry, continuous  . EKG 12-Lead  . ED EKG 12-Lead     Following Medications were ordered in ER: Medications  traMADol (ULTRAM) tablet 50 mg (has no administration in time range)  losartan (COZAAR) tablet 50 mg (50 mg Oral Given 09/18/19 2113)  simvastatin (ZOCOR) tablet 40 mg (has no administration in time range)  pantoprazole (PROTONIX) EC tablet 40 mg (40 mg Oral Given 09/18/19 2113)  enoxaparin (LOVENOX) injection 40  mg (40 mg Subcutaneous Given 09/18/19 2235)  sodium chloride flush (NS) 0.9 % injection 3 mL (3 mLs Intravenous Given 09/18/19 2114)  sodium chloride flush (NS) 0.9 % injection 3 mL (3 mLs Intravenous Given 09/18/19 2115)  sodium chloride flush (NS) 0.9 % injection 3 mL (has no administration in time range)  0.9 %  sodium chloride infusion (has no administration in time range)  guaiFENesin-dextromethorphan (ROBITUSSIN DM) 100-10 MG/5ML syrup 10 mL (has no administration in time range)  acetaminophen (TYLENOL) tablet 650 mg (has no administration in time range)  HYDROcodone-acetaminophen (NORCO/VICODIN) 5-325 MG per tablet 1-2 tablet (has no administration  in time range)  ondansetron (ZOFRAN) tablet 4 mg (has no administration in time range)    Or  ondansetron (ZOFRAN) injection 4 mg (has no administration in time range)  insulin aspart (novoLOG) injection 0-9 Units (3 Units Subcutaneous Given 09/19/19 0019)  dexamethasone (DECADRON) injection 6 mg (has no administration in time range)  remdesivir 200 mg in sodium chloride 0.9% 250 mL IVPB (0 mg Intravenous Stopped 09/18/19 2156)    Followed by  remdesivir 100 mg in sodium chloride 0.9 % 100 mL IVPB (has no administration in time range)  aspirin chewable tablet 81 mg (has no administration in time range)  albuterol (VENTOLIN HFA) 108 (90 Base) MCG/ACT inhaler 2 puff (2 puffs Inhalation Given 09/18/19 1709)  dexamethasone (DECADRON) injection 6 mg (6 mg Intravenous Given 09/18/19 1829)  acetaminophen (TYLENOL) tablet 650 mg (650 mg Oral Given 09/18/19 1828)  aspirin chewable tablet 324 mg (324 mg Oral Given 09/18/19 1943)        Consult Orders  (From admission, onward)         Start     Ordered   09/18/19 1926  Consult to hospitalist  ALL PATIENTS BEING ADMITTED/HAVING PROCEDURES NEED COVID-19 SCREENING  Once    Comments: ALL PATIENTS BEING ADMITTED/HAVING PROCEDURES NEED COVID-19 SCREENING  Provider:  (Not yet assigned)  Question Answer Comment  Place call to: [facility]   Reason for Consult Admit      09/18/19 1925           Significant initial  Findings: Abnormal Labs Reviewed  CBC WITH DIFFERENTIAL/PLATELET - Abnormal; Notable for the following components:      Result Value   Lymphs Abs 0.5 (*)    All other components within normal limits  COMPREHENSIVE METABOLIC PANEL - Abnormal; Notable for the following components:   Chloride 95 (*)    Glucose, Bld 250 (*)    BUN 36 (*)    Creatinine, Ser 1.37 (*)    Albumin 3.2 (*)    GFR calc non Af Amer 38 (*)    GFR calc Af Amer 44 (*)    Anion gap 16 (*)    All other components within normal limits  D-DIMER,  QUANTITATIVE (NOT AT [facility]) - Abnormal; Notable for the following components:   D-Dimer, Quant 1.54 (*)    All other components within normal limits  LACTATE DEHYDROGENASE - Abnormal; Notable for the following components:   LDH 340 (*)    All other components within normal limits  FERRITIN - Abnormal; Notable for the following components:   Ferritin 541 (*)    All other components within normal limits  TRIGLYCERIDES - Abnormal; Notable for the following components:   Triglycerides 202 (*)    All other components within normal limits  FIBRINOGEN - Abnormal; Notable for the following components:   Fibrinogen >800 (*)    All  other components within normal limits  C-REACTIVE PROTEIN - Abnormal; Notable for the following components:   CRP 32.6 (*)    All other components within normal limits  TROPONIN I (HIGH SENSITIVITY) - Abnormal; Notable for the following components:   Troponin I (High Sensitivity) 24 (*)    All other components within normal limits     Otherwise labs showing:    Recent Labs  Lab 09/18/19 1644  NA 138  K 4.3  CO2 27  GLUCOSE 250*  BUN 36*  CREATININE 1.37*  CALCIUM 9.2      Lab Results  Component Value Date   CREATININE 1.37 (H) 09/18/2019    Recent Labs  Lab 09/18/19 1644  AST 35  ALT 17  ALKPHOS 63  BILITOT 0.5  PROT 7.7  ALBUMIN 3.2*   Lab Results  Component Value Date   CALCIUM 9.2 09/18/2019     WBC      Component Value Date/Time   WBC 6.5 09/18/2019 1644   ANC    Component Value Date/Time   NEUTROABS 5.7 09/18/2019 1644   ALC No components found for: LYMPHAB    Plt: Lab Results  Component Value Date   PLT 340 09/18/2019     Lactic Acid, Venous    Component Value Date/Time   LATICACIDVEN 1.8 09/18/2019 1644    Procalcitonin 0.19   COVID-19 Labs  Recent Labs    09/18/19 1644  DDIMER 1.54*  FERRITIN 541*  LDH 340*  CRP 32.6*    Lab Results  Component Value Date   SARSCOV2NAA DETECTED (A) 09/14/2019       HG/HCT   Stable     Component Value Date/Time   HGB 12.3 09/18/2019 1644   HCT 37.4 09/18/2019 1644     Troponin 24-27-32 Cardiac Panel (last 3 results) No results for input(s): CKTOTAL, CKMB, TROPONINI, RELINDX in the last 72 hours.     ECG: Ordered Personally reviewed by me showing: HR : 87 Rhythm:  NSR  Diffuse Ischemic changes  QTC 511      UA   not ordered   Urine analysis: No results found for: COLORURINE, APPEARANCEUR, LABSPEC, PHURINE, GLUCOSEU, HGBUR, BILIRUBINUR, KETONESUR, PROTEINUR, UROBILINOGEN, NITRITE, LEUKOCYTESUR     Ordered   CXR -  bilateral multifocal pneumonia       ED Triage Vitals  Enc Vitals Group     BP 09/18/19 1536 122/67     Pulse Rate 09/18/19 1536 99     Resp 09/18/19 1536 16     Temp 09/18/19 1536 99.1 F (37.3 C)     Temp src --      SpO2 09/18/19 1536 (!) 88 %     Weight 09/18/19 1600 160 lb (72.6 kg)     Height 09/18/19 1600 5\' 3"  (1.6 m)     Head Circumference --      Peak Flow --      Pain Score 09/18/19 1600 0     Pain Loc --      Pain Edu? --      Excl. in [place]? --   TMAX(24)@       Latest  Blood pressure 111/72, pulse 77, temperature 99.1 F (37.3 C), resp. rate (!) 28, height 5\' 3"  (1.6 m), weight 72.6 kg, SpO2 94 %.    Hospitalist was called for admission for Covid pneumonia and abnormal EKG   Review of Systems:    Pertinent positives include: fatigue,   Constitutional:  No weight loss, night  sweats, Fevers, chills, weight loss  HEENT:  No headaches, Difficulty swallowing,Tooth/dental problems,Sore throat,  No sneezing, itching, ear ache, nasal congestion, post nasal drip,  Cardio-vascular:  No chest pain, Orthopnea, PND, anasarca, dizziness, palpitations.no Bilateral lower extremity swelling  GI:  No heartburn, indigestion, abdominal pain, nausea, vomiting, diarrhea, change in bowel habits, loss of appetite, melena, blood in stool, hematemesis Resp:  no shortness of breath at rest. No dyspnea on  exertion, No excess mucus, no productive cough, No non-productive cough, No coughing up of blood.No change in color of mucus.No wheezing. Skin:  no rash or lesions. No jaundice GU:  no dysuria, change in color of urine, no urgency or frequency. No straining to urinate.  No flank pain.  Musculoskeletal:  No joint pain or no joint swelling. No decreased range of motion. No back pain.  Psych:  No change in mood or affect. No depression or anxiety. No memory loss.  Neuro: no localizing neurological complaints, no tingling, no weakness, no double vision, no gait abnormality, no slurred speech, no confusion  All systems reviewed and apart from Daniel all are negative  Past Medical History:   Past Medical History:  Diagnosis Date  . Diabetes mellitus without complication ([place])   . Hypercholesteremia   . Hypertension       Past Surgical History:  Procedure Laterality Date  . ABDOMINAL HYSTERECTOMY    . FOOT SURGERY      Social History:  Ambulatory  independently      reports that she has been smoking. She has never used smokeless tobacco. She reports that she does not drink alcohol. No history on file for drug.     Family History:   Family History  Problem Relation Age of Onset  . Diabetes Other     Allergies: No Known Allergies   Prior to Admission medications   Medication Sig Start Date End Date Taking? Authorizing Provider  ACCU-CHEK SMARTVIEW test strip  08/27/19   [provider]  aspirin 81 MG chewable tablet Chew by mouth daily.    [provider]  diclofenac (VOLTAREN) 75 MG EC tablet Take 1 tablet (75 mg total) by mouth 2 (two) times daily. 02/25/17   Leandrew Koyanagi, MD  diclofenac (VOLTAREN) 75 MG EC tablet Take 1 tablet (75 mg total) by mouth 2 (two) times daily. 09/05/17   Leandrew Koyanagi, MD  diclofenac sodium (VOLTAREN) 1 % GEL Apply 2 g topically 4 (four) times daily. 09/05/17   Leandrew Koyanagi, MD  famotidine (PEPCID) 20 MG tablet Take 20 mg by  mouth 2 (two) times daily. 05/28/19   [provider]  guaiFENesin-codeine (ROBITUSSIN AC) 100-10 MG/5ML syrup Take 10 mLs by mouth 4 (four) times daily as needed for cough. 09/06/16   Billy Fischer, MD  hydrochlorothiazide (HYDRODIURIL) 25 MG tablet Take 25 mg by mouth daily.    [provider]  ipratropium (ATROVENT) 0.06 % nasal spray Place 2 sprays into both nostrils 4 (four) times daily. 09/06/16   Billy Fischer, MD  levofloxacin (LEVAQUIN) 500 MG tablet Take 1 tablet (500 mg total) by mouth daily. 09/06/16   Billy Fischer, MD  losartan (COZAAR) 50 MG tablet Take 50 mg by mouth daily.    [provider]  meloxicam (MOBIC) 7.5 MG tablet Take 1 tablet (7.5 mg total) by mouth 2 (two) times daily as needed for pain. 02/01/17   Leandrew Koyanagi, MD  METFORMIN HCL PO Take by mouth.  [provider]  naproxen (NAPROSYN) 375 MG tablet Take 1 tablet (375 mg total) by mouth 2 (two) times daily. 09/14/19   Wieters, Hallie C, PA-C  Omeprazole (PRILOSEC PO) Take by mouth.    [provider]  simvastatin (ZOCOR) 40 MG tablet  10/12/16   [provider]  traMADol (ULTRAM) 50 MG tablet Take 1 tablet (50 mg total) by mouth every 6 (six) hours as needed. 03/08/16   Billy Fischer, MD   Physical Exam: Blood pressure 111/72, pulse 77, temperature 99.1 F (37.3 C), resp. rate (!) 28, height 5\' 3"  (1.6 m), weight 72.6 kg, SpO2 94 %. 1. General:  in No   Acute distress   Chronically ill  -appearing 2. Psychological: Alert and  Oriented 3. Head/ENT:    Dry Mucous Membranes                          Head Non traumatic, neck supple                           Poor Dentition 4. SKIN:  decreased Skin turgor,  Skin clean Dry and intact no rash 5. Heart: Regular rate and rhythm no  Murmur, no Rub or gallop 6. Lungs:   no wheezes or crackles   7. Abdomen: Soft,  non-tender, Non distended  bowel sounds present 8. Lower extremities: no clubbing, cyanosis, no  edema 9.  Neurologically Grossly intact, moving all 4 extremities equally  10. MSK: Normal range of motion   All other LABS:     Recent Labs  Lab 09/18/19 1644  WBC 6.5  NEUTROABS 5.7  HGB 12.3  HCT 37.4  MCV 88.8  PLT 340     Recent Labs  Lab 09/18/19 1644  NA 138  K 4.3  CL 95*  CO2 27  GLUCOSE 250*  BUN 36*  CREATININE 1.37*  CALCIUM 9.2     Recent Labs  Lab 09/18/19 1644  AST 35  ALT 17  ALKPHOS 63  BILITOT 0.5  PROT 7.7  ALBUMIN 3.2*       Cultures: No results found for: SDES, SPECREQUEST, CULT, REPTSTATUS   Radiological Exams on Admission: DG Chest Port 1 View  Result Date: 09/18/2019 CLINICAL DATA:  Hypoxia.  Weakness. EXAM: PORTABLE CHEST 1 VIEW COMPARISON:  09/06/2016. FINDINGS: There are hazy bilateral peripheral airspace lung opacities, new since the prior exam. No pleural effusion or pneumothorax. Cardiac silhouette is top in size. Aorta is tortuous. No mediastinal or hilar masses. Skeletal structures are grossly intact. IMPRESSION: 1. Hazy bilateral peripheral airspace lung opacities consistent with multifocal pneumonia. The pattern is suspicious for COVID-19 infection Electronically Signed   By: Lajean Manes M.D.   On: 09/18/2019 17:24    Chart has been reviewed   Assessment/Plan  73 y.o. female with medical history significant of known Covid infection diabetes type 2, HTN, COPD, HLD  Admitted for Covid pneumonia and abnormal EKG  Present on Admission: . Pneumonia due to COVID-19 virus -  FROM HOME   WITH KNOWN HX OF COVID19   Following concerning LAB/ imaging findings:  CBC: leukopenia, lymphopenia   ANC/ALC ratio>3.5 BMP: increased BUN/Cr    Procalcitonin: low   CXR: hazy bilateral peripheral opacities       Following complications noted:  elevated troponin noted likely demand ischemia will continue to follow Cardiology consulted recommended heparin and echogram in the morning   evidence  of AKI - will provide gentle rehydration     Plan of treatment: - Transfer to [facility] facility if needs >4L of O2  -given Hypoxia and /or infiltrates initiate steroids Decadron 6mg  q 24 hours And pharmacy consult for remdesivir - Will follow daily d.dimer - Assess for ability to prone  - Supportive management -Fluid sparing resuscitation  -Provide oxygen as needed currently on   SpO2: 92 % O2 Flow Rate (L/min): 2 L/min    Poor Prognostic factors  73 y.o.  Personal hx of  DM2 , HTN, obesity  Evidence of  organ damage  Present  elevated trop, AKI,   tachypnea,    ABS neutrophil to lymphocyte ratio >3.5    Will order Airborne and Contact precautions      . Abnormal ECG -appreciate cardiology input.  Worrisome for ischemia related to Covid infection.  Will obtain echogram in the morning initiate heparin as per cardiology recommendation cycle cardiac enzymes monitor for any signs of angina Obtain serial EKGs and monitor for any change or progression At this point chest pain-free  . HTN (hypertension) -stable continue home medications  . Elevated troponin -continue to monitor, currently on heparin echogram in a.m.  Marland Kitchen Prolonged QT interval- - will monitor on tele avoid QT prolonging medications, rehydrate correct electrolytes  TOBACCO abuse - spoke about importance of quitting, tobacco secession protocol  Other plan as per orders.  DVT prophylaxis: heparin    Code Status:  FULL CODE   as per patient     Family Communication:   Family not at  Bedside    Disposition Plan    To home once workup is complete and patient is stable      Consults called: CARDIOLOGY   Admission status:  ED Disposition    ED Disposition Condition [place]: [place] [[id number]]  Level of Care: Telemetry Cardiac [[id number]]  I expect the patient will be discharged within 24 hours: No (not a candidate for 5C-Observation unit)  Covid Evaluation: Confirmed COVID Positive  Diagnosis: Pneumonia due to  COVID-19 virus [id number]  Admitting Physician: Toy Baker [[id number]]  Attending Physician: Toy Baker [[id number]]       Obs      Level of care    Tele indefinitely please discontinue once patient no longer qualifies   Precautions:  covid positive Airborne and Contact precautions    PPE: Used by the provider:   P100  eye Goggles,  Gloves  gown     Iyah Laguna 09/19/2019, 1:23 AM    [facility]     after 2 AM please page floor coverage PA If 7AM-7PM, please contact the day team taking care of the patient using [email or web address]

## 2019-09-19 ENCOUNTER — Encounter (HOSPITAL_COMMUNITY): Payer: Self-pay | Admitting: Internal Medicine

## 2019-09-19 ENCOUNTER — Observation Stay (HOSPITAL_COMMUNITY): Payer: Medicare HMO

## 2019-09-19 DIAGNOSIS — E119 Type 2 diabetes mellitus without complications: Secondary | ICD-10-CM | POA: Diagnosis not present

## 2019-09-19 DIAGNOSIS — R9431 Abnormal electrocardiogram [ECG] [EKG]: Secondary | ICD-10-CM | POA: Diagnosis not present

## 2019-09-19 DIAGNOSIS — U071 COVID-19: Principal | ICD-10-CM

## 2019-09-19 DIAGNOSIS — Z7982 Long term (current) use of aspirin: Secondary | ICD-10-CM | POA: Diagnosis not present

## 2019-09-19 DIAGNOSIS — R778 Other specified abnormalities of plasma proteins: Secondary | ICD-10-CM | POA: Diagnosis not present

## 2019-09-19 DIAGNOSIS — J440 Chronic obstructive pulmonary disease with acute lower respiratory infection: Secondary | ICD-10-CM | POA: Diagnosis present

## 2019-09-19 DIAGNOSIS — E1165 Type 2 diabetes mellitus with hyperglycemia: Secondary | ICD-10-CM | POA: Diagnosis present

## 2019-09-19 DIAGNOSIS — J1289 Other viral pneumonia: Secondary | ICD-10-CM

## 2019-09-19 DIAGNOSIS — Z791 Long term (current) use of non-steroidal anti-inflammatories (NSAID): Secondary | ICD-10-CM | POA: Diagnosis not present

## 2019-09-19 DIAGNOSIS — E785 Hyperlipidemia, unspecified: Secondary | ICD-10-CM | POA: Diagnosis present

## 2019-09-19 DIAGNOSIS — Z7984 Long term (current) use of oral hypoglycemic drugs: Secondary | ICD-10-CM | POA: Diagnosis not present

## 2019-09-19 DIAGNOSIS — I129 Hypertensive chronic kidney disease with stage 1 through stage 4 chronic kidney disease, or unspecified chronic kidney disease: Secondary | ICD-10-CM | POA: Diagnosis present

## 2019-09-19 DIAGNOSIS — N179 Acute kidney failure, unspecified: Secondary | ICD-10-CM | POA: Diagnosis present

## 2019-09-19 DIAGNOSIS — I10 Essential (primary) hypertension: Secondary | ICD-10-CM

## 2019-09-19 DIAGNOSIS — F17200 Nicotine dependence, unspecified, uncomplicated: Secondary | ICD-10-CM | POA: Diagnosis present

## 2019-09-19 DIAGNOSIS — J9601 Acute respiratory failure with hypoxia: Secondary | ICD-10-CM

## 2019-09-19 DIAGNOSIS — N1832 Chronic kidney disease, stage 3b: Secondary | ICD-10-CM | POA: Diagnosis present

## 2019-09-19 DIAGNOSIS — E1122 Type 2 diabetes mellitus with diabetic chronic kidney disease: Secondary | ICD-10-CM | POA: Diagnosis present

## 2019-09-19 DIAGNOSIS — E7800 Pure hypercholesterolemia, unspecified: Secondary | ICD-10-CM | POA: Diagnosis present

## 2019-09-19 LAB — COMPREHENSIVE METABOLIC PANEL
ALT: 17 U/L (ref 0–44)
AST: 28 U/L (ref 15–41)
Albumin: 2.7 g/dL — ABNORMAL LOW (ref 3.5–5.0)
Alkaline Phosphatase: 53 U/L (ref 38–126)
Anion gap: 16 — ABNORMAL HIGH (ref 5–15)
BUN: 38 mg/dL — ABNORMAL HIGH (ref 8–23)
CO2: 22 mmol/L (ref 22–32)
Calcium: 8.6 mg/dL — ABNORMAL LOW (ref 8.9–10.3)
Chloride: 100 mmol/L (ref 98–111)
Creatinine, Ser: 1.43 mg/dL — ABNORMAL HIGH (ref 0.44–1.00)
GFR calc Af Amer: 42 mL/min — ABNORMAL LOW (ref >60)
GFR calc non Af Amer: 36 mL/min — ABNORMAL LOW (ref >60)
Glucose, Bld: 264 mg/dL — ABNORMAL HIGH (ref 70–99)
Potassium: 4.1 mmol/L (ref 3.5–5.1)
Sodium: 138 mmol/L (ref 135–145)
Total Bilirubin: 0.6 mg/dL (ref 0.3–1.2)
Total Protein: 6.7 g/dL (ref 6.5–8.1)

## 2019-09-19 LAB — CBG MONITORING, ED
Glucose-Capillary: 243 mg/dL — ABNORMAL HIGH (ref 70–99)
Glucose-Capillary: 191 mg/dL — ABNORMAL HIGH (ref 70–99)
Glucose-Capillary: 201 mg/dL — ABNORMAL HIGH (ref 70–99)
Glucose-Capillary: 212 mg/dL — ABNORMAL HIGH (ref 70–99)
Glucose-Capillary: 259 mg/dL — ABNORMAL HIGH (ref 70–99)
Glucose-Capillary: 217 mg/dL — ABNORMAL HIGH (ref 70–99)

## 2019-09-19 LAB — CBC WITH DIFFERENTIAL/PLATELET
Abs Immature Granulocytes: 0.03 10*3/uL (ref 0.00–0.07)
Basophils Absolute: 0 10*3/uL (ref 0.0–0.1)
Basophils Relative: 0 %
Eosinophils Absolute: 0 10*3/uL (ref 0.0–0.5)
Eosinophils Relative: 0 %
HCT: 33.4 % — ABNORMAL LOW (ref 36.0–46.0)
Hemoglobin: 10.8 g/dL — ABNORMAL LOW (ref 12.0–15.0)
Immature Granulocytes: 0 %
Lymphocytes Relative: 8 %
Lymphs Abs: 0.5 10*3/uL — ABNORMAL LOW (ref 0.7–4.0)
MCH: 29.2 pg (ref 26.0–34.0)
MCHC: 32.3 g/dL (ref 30.0–36.0)
MCV: 90.3 fL (ref 80.0–100.0)
Monocytes Absolute: 0.1 10*3/uL (ref 0.1–1.0)
Monocytes Relative: 2 %
Neutro Abs: 6.1 10*3/uL (ref 1.7–7.7)
Neutrophils Relative %: 90 %
Platelets: 321 10*3/uL (ref 150–400)
RBC: 3.7 MIL/uL — ABNORMAL LOW (ref 3.87–5.11)
RDW: 12.9 % (ref 11.5–15.5)
WBC: 6.8 10*3/uL (ref 4.0–10.5)
nRBC: 0 % (ref 0.0–0.2)

## 2019-09-19 LAB — ABO/RH: ABO/RH(D): O POS

## 2019-09-19 LAB — ECHOCARDIOGRAM COMPLETE
Height: 63 in
Weight: 2560 oz

## 2019-09-19 LAB — TROPONIN I (HIGH SENSITIVITY): Troponin I (High Sensitivity): 32 ng/L — ABNORMAL HIGH (ref <18)

## 2019-09-19 LAB — D-DIMER, QUANTITATIVE: D-Dimer, Quant: 1.47 ug/mL-FEU — ABNORMAL HIGH (ref 0.00–0.50)

## 2019-09-19 LAB — MAGNESIUM: Magnesium: 2.1 mg/dL (ref 1.7–2.4)

## 2019-09-19 LAB — FERRITIN: Ferritin: 593 ng/mL — ABNORMAL HIGH (ref 11–307)

## 2019-09-19 LAB — HEPARIN LEVEL (UNFRACTIONATED): Heparin Unfractionated: 0.53 IU/mL (ref 0.30–0.70)

## 2019-09-19 LAB — C-REACTIVE PROTEIN: CRP: 26.5 mg/dL — ABNORMAL HIGH (ref <1.0)

## 2019-09-19 MED ORDER — HEPARIN BOLUS VIA INFUSION
2000.0000 [IU] | Freq: Once | INTRAVENOUS | Status: AC
  Administered 2019-09-19: 2000 [IU] via INTRAVENOUS
  Filled 2019-09-19: qty 2000

## 2019-09-19 MED ORDER — HEPARIN (PORCINE) 25000 UT/250ML-% IV SOLN
850.0000 [IU]/h | INTRAVENOUS | Status: DC
  Administered 2019-09-19 – 2019-09-20 (×2): 850 [IU]/h via INTRAVENOUS
  Filled 2019-09-19 (×2): qty 250

## 2019-09-19 NOTE — Progress Notes (Signed)
ANTICOAGULATION CONSULT NOTE - Follow Up Consult  Pharmacy Consult for Heparin Indication: chest pain/ACS  No Known Allergies  Patient Measurements: Height: 5\' 3"  (160 cm) Weight: 160 lb (72.6 kg) IBW/kg (Calculated) : 52.4 Heparin Dosing Weight 68kg  Vital Signs: BP: 127/65 (12/16 1700) Pulse Rate: 64 (12/16 1700)  Labs: Recent Labs    09/18/19 1644 09/18/19 1822 09/18/19 2100 09/18/19 2300 09/19/19 0043 09/19/19 1722  HGB 12.3  --   --   --  10.8*  --   HCT 37.4  --   --   --  33.4*  --   PLT 340  --   --   --  321  --   HEPARINUNFRC  --   --   --   --   --  0.53  CREATININE 1.37*  --   --   --  1.43*  --   TROPONINIHS  --  24* 27* 32*  --   --     Estimated Creatinine Clearance: 33 mL/min (A) (by C-G formula based on SCr of 1.43 mg/dL (H)).   Medical History: Past Medical History:  Diagnosis Date  . Diabetes mellitus without complication ([place])   . Hypercholesteremia   . Hypertension    Assessment: 73 y.o. F presents with generalized weakness found to be COVID positive. Pt with EKG changes suggestive of diffuse ischemia per cardiology. Pharmacy consulted to dose heparin for ACS.  Heparin level therapeutic this afternoon at 0.53. No active bleed issues reported per RN. She does note that heparin began running at [facility] for somewhere between 5-10 minutes during the time lab was drawing the heparin level but has now been corrected and believes pump was running at desired rate 850 units/hr since resumed at 1300. Will check confirmatory heparin level.   Goal of Therapy:  Heparin level 0.3-0.7 units/ml Monitor platelets by anticoagulation protocol: Yes   Plan:  Continue heparin 850 units/hr  6h heparin level to confirm Monitor daily heparin level and CBC, s/sx bleeding   Elicia Lamp, PharmD, BCPS Please check AMION for all [place] contact numbers Clinical Pharmacist 09/19/2019 6:52 PM

## 2019-09-19 NOTE — ED Notes (Signed)
Lunch tray ordered 

## 2019-09-19 NOTE — ED Notes (Signed)
CBG 201 

## 2019-09-19 NOTE — Progress Notes (Signed)
  Echocardiogram 2D Echocardiogram has been performed.  Jennette Dubin 09/19/2019, 2:35 PM

## 2019-09-19 NOTE — Progress Notes (Signed)
ANTICOAGULATION CONSULT NOTE - Initial Consult  Pharmacy Consult for Heparin Indication: chest pain/ACS  No Known Allergies  Patient Measurements: Height: 5\' 3"  (160 cm) Weight: 160 lb (72.6 kg) IBW/kg (Calculated) : 52.4 Heparin Dosing Weight 68kg  Vital Signs: Temp: 99.1 F (37.3 C) (12/15 1536) BP: 131/69 (12/15 2330) Pulse Rate: 75 (12/15 2330)  Labs: Recent Labs    09/18/19 1644 09/18/19 1822 09/18/19 2100 09/18/19 2300 09/19/19 0043  HGB 12.3  --   --   --  10.8*  HCT 37.4  --   --   --  33.4*  PLT 340  --   --   --  321  CREATININE 1.37*  --   --   --   --   TROPONINIHS  --  24* 27* 32*  --     Estimated Creatinine Clearance: 34.4 mL/min (A) (by C-G formula based on SCr of 1.37 mg/dL (H)).   Medical History: Past Medical History:  Diagnosis Date  . Diabetes mellitus without complication ([place])   . Hypercholesteremia   . Hypertension     Medications:  See electronic med rec  Assessment: 73 y.o. F presents with generalized weakness - positive COVID test. Pt with EKG changes suggestive of diffuse ischemia per cardiology. To begin heparin per pharmacy. CBC ok at baseline. No AC PTA.  Received Lovenox 40mg  12/15 2245  Goal of Therapy:  Heparin level 0.3-0.7 units/ml Monitor platelets by anticoagulation protocol: Yes   Plan:  Heparin 2000 IV bolus (small bolus with Lovenox recently given) Heparin gtt at 850 units/hr Will f/u heparin level in 8 hours Daily heparin level and CBC  Sherlon Handing, PharmD, BCPS Please see amion for complete clinical pharmacist phone list 09/19/2019,1:14 AM

## 2019-09-19 NOTE — Progress Notes (Signed)
Inpatient Diabetes Program Recommendations  AACE/ADA: New Consensus Statement on Inpatient Glycemic Control (2015)  Target Ranges:  Prepandial:   less than 140 mg/dL      Peak postprandial:   less than 180 mg/dL (1-2 hours)      Critically ill patients:  140 - 180 mg/dL   Lab Results  Component Value Date   GLUCAP 201 (H) 09/19/2019   HGBA1C 8.2 (H) 09/18/2019    Review of Glycemic Control Results for Jacqueline Barnett, Jacqueline Barnett [facility] (MRN [id number]) as of 09/19/2019 13:16  Ref. Range 09/18/2019 23:17 09/19/2019 03:06 09/19/2019 08:26 09/19/2019 11:38  Glucose-Capillary Latest Ref Range: 70 - 99 mg/dL 225 (H) 243 (H) 191 (H) 201 (H)   Diabetes history: DM 2 Outpatient Diabetes medications:  Metformin 1500 mg with breakfast  Current orders for Inpatient glycemic control:  Novolog sensitive q 4 hours Decadron 6 mg q 24 hours Inpatient Diabetes Program Recommendations:    Please consider adding Levemir 6 units bid while on steroids.   Thanks  Adah Perl, RN, BC-ADM Inpatient Diabetes Coordinator Pager [phone] (8a-5p)

## 2019-09-19 NOTE — ED Notes (Signed)
Pt. Began to stat at 88 -90 on 4L [place]. Previous Nurse stated this was new. Went in and evaluated pleth which was good and observed same. Pt. Seemed to be unaffected by low stats. Moved up to 5L [place] and Pt is between 91-93. Pt. Is alert and  Oriented.

## 2019-09-19 NOTE — Progress Notes (Signed)
ANTICOAGULATION CONSULT NOTE - Follow Up Consult  Pharmacy Consult for Heparin Indication: chest pain/ACS  No Known Allergies  Patient Measurements: Height: 5\' 3"  (160 cm) Weight: 160 lb (72.6 kg) IBW/kg (Calculated) : 52.4 Heparin Dosing Weight 68kg  Vital Signs: BP: 111/74 (12/16 1100) Pulse Rate: 67 (12/16 1100)  Labs: Recent Labs    09/18/19 1644 09/18/19 1822 09/18/19 2100 09/18/19 2300 09/19/19 0043  HGB 12.3  --   --   --  10.8*  HCT 37.4  --   --   --  33.4*  PLT 340  --   --   --  321  CREATININE 1.37*  --   --   --  1.43*  TROPONINIHS  --  24* 27* 32*  --     Estimated Creatinine Clearance: 33 mL/min (A) (by C-G formula based on SCr of 1.43 mg/dL (H)).   Medical History: Past Medical History:  Diagnosis Date  . Diabetes mellitus without complication ([place])   . Hypercholesteremia   . Hypertension    Assessment: 73 y.o. F presents with generalized weakness found to be COVID positive. Pt with EKG changes suggestive of diffuse ischemia per cardiology. Pharmacy consulted to dose heparin for ACS. Patient started on heparin 850 units/hr with plan to obtain heparin level at 1000 on 12/16. Spoke with RN when heparin level was delayed being obtained and found heparin had been off for ~30 minutes. Heparin was resumed at heparin 850 units/hr at ~1300. Hgb 10.8. Plt 321.   Goal of Therapy:  Heparin level 0.3-0.7 units/ml Monitor platelets by anticoagulation protocol: Yes   Plan:  Continue heparin 850 units/hr  Obtain heparin level in 4 hours at 1700 on 12/16 Monitor heparin level, CBC, and S/S of bleeding   Cristela Felt, PharmD PGY1 Pharmacy Resident [employer]: [phone]  09/19/2019,12:56 PM

## 2019-09-19 NOTE — Progress Notes (Addendum)
Progress Note    Jacqueline Barnett  [id number] DOB: 1946/08/08  DOA: 09/18/2019 PCP: Wenda Low, MD      Brief Narrative:    Medical records reviewed and are as summarized below:  Jacqueline Barnett is an 73 y.o. female with medical history significant for hypertension, type 2 diabetes mellitus, probable CKD stage III, was referred from the urgent care clinic to the emergency room because of positive coronavirus test.  She complained of generalized weakness.  In the ED, her oxygen saturation was 88% on room air.  X-ray showed multifocal pneumonia due to coronavirus infection.      Assessment/Plan:   Active Problems:   Pneumonia due to COVID-19 virus   DM2 (diabetes mellitus, type 2) (HCC)   Abnormal ECG   HTN (hypertension)   Elevated troponin   Prolonged QT interval   Acute hypoxemic respiratory failure (HCC)   Body mass index is 28.34 kg/m.   COVID-19 pneumonia: Continue IV Remdesivir and IV dexamethasone.  Monitor inflammatory markers.  Acute hypoxemic respiratory failure: Currently is on 4 L/min oxygen via nasal cannula.  Taper off as able.  Abnormal EKG (Diffuse T wave inversion, QTC prolongation)/mildly elevated troponin patient has been seen by cardiologist who recommended heparin infusion for 48 hours.  Continue aspirin and simvastatin.  2D echo is pending  Type 2 diabetes mellitus: NovoLog as needed for hyperglycemia.  Start Lantus 5 units nightly.  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   Family Communication/Anticipated D/C date and plan/Code Status   DVT prophylaxis: Lovenox Code Status: Full code Family Communication: Plan discussed with the patient Disposition Plan: To be determined      Subjective:   She complains of shortness of breath.  No cough or chest pain.  Objective:    Vitals:   09/19/19 0900 09/19/19 0915 09/19/19  1000 09/19/19 1100  BP: (!) 142/73   111/74  Pulse: 71 72 67 67  Resp: (!) 27 (!) 27 (!) 29 (!) 29  Temp:      SpO2: 92% 94% 94% 94%  Weight:      Height:       No intake or output data in the 24 hours ending 09/19/19 1200 Filed Weights   09/18/19 1600  Weight: 72.6 kg    Exam:  GEN: NAD SKIN: No rash EYES: EOMI ENT: MMM CV: RRR PULM: Adequate bilaterally, bibasilar rales, no wheezing ABD: soft, ND, NT, +BS CNS: AAO x 3, non focal EXT: No edema or tenderness   Data Reviewed:   I have personally reviewed following labs and imaging studies:  Labs: Labs show the following:   Basic Metabolic Panel: Recent Labs  Lab 09/18/19 1644 09/19/19 0043  NA 138 138  K 4.3 4.1  CL 95* 100  CO2 27 22  GLUCOSE 250* 264*  BUN 36* 38*  CREATININE 1.37* 1.43*  CALCIUM 9.2 8.6*  MG  --  2.1   GFR Estimated Creatinine Clearance: 33 mL/min (A) (by C-G formula based on SCr of 1.43 mg/dL (H)). Liver Function Tests: Recent Labs  Lab 09/18/19 1644 09/19/19 0043  AST 35 28  ALT 17 17  ALKPHOS 63 53  BILITOT 0.5 0.6  PROT 7.7 6.7  ALBUMIN 3.2* 2.7*   No results for input(s): LIPASE, AMYLASE in the last 168 hours. No results for input(s): AMMONIA in the last 168 hours. Coagulation profile No results for input(s): INR, PROTIME in  the last 168 hours.  CBC: Recent Labs  Lab 09/18/19 1644 09/19/19 0043  WBC 6.5 6.8  NEUTROABS 5.7 6.1  HGB 12.3 10.8*  HCT 37.4 33.4*  MCV 88.8 90.3  PLT 340 321   Cardiac Enzymes: No results for input(s): CKTOTAL, CKMB, CKMBINDEX, TROPONINI in the last 168 hours. BNP (last 3 results) No results for input(s): PROBNP in the last 8760 hours. CBG: Recent Labs  Lab 09/18/19 2056 09/18/19 2317 09/19/19 0306 09/19/19 0826 09/19/19 1138  GLUCAP 196* 225* 243* 191* 201*   D-Dimer: Recent Labs    09/18/19 1644 09/19/19 0506  DDIMER 1.54* 1.47*   Hgb A1c: Recent Labs    09/18/19 2100  HGBA1C 8.2*   Lipid Profile: Recent  Labs    09/18/19 1644  TRIG 202*   Thyroid function studies: No results for input(s): TSH, T4TOTAL, T3FREE, THYROIDAB in the last 72 hours.  Invalid input(s): FREET3 Anemia work up: Recent Labs    09/18/19 1644 09/19/19 0043  FERRITIN 541* 593*   Sepsis Labs: Recent Labs  Lab 09/18/19 1644 09/19/19 0043  PROCALCITON 0.19  --   WBC 6.5 6.8  LATICACIDVEN 1.8  --     Microbiology Recent Results (from the past 240 hour(s))  Novel Coronavirus, NAA (Hosp order, Send-out to Ref Lab; TAT 18-24 hrs     Status: Abnormal   Collection Time: 09/14/19  1:17 PM   Specimen: Nasopharyngeal Swab; Respiratory  Result Value Ref Range Status   SARS-CoV-2, NAA DETECTED (A) NOT DETECTED Final    Comment: (NOTE)                  Client Requested Flag This nucleic acid amplification test was developed and its performance characteristics determined by [employer]. Nucleic acid amplification tests include PCR and TMA. This test has not been FDA cleared or approved. This test has been authorized by FDA under an Emergency Use Authorization (EUA). This test is only authorized for the duration of time the declaration that circumstances exist justifying the authorization of the emergency use of in vitro diagnostic tests for detection of SARS-CoV-2 virus and/or diagnosis of COVID-19 infection under section 564(b)(1) of the Act, 21 U.S.C. [id number]) (1), unless the authorization is terminated or revoked sooner. When diagnostic testing is negative, the possibility of a false negative result should be considered in the context of a patient's recent exposures and the presence of clinical signs and symptoms consistent with COVID-19. An individual without symptoms of COVID-  19 and who is not shedding SARS-CoV-2 virus would expect to have a negative (not detected) result in this assay. Performed At: [facility] [address] [place], [place] [id number] Rush Farmer MD  [id number]    [place]  Final    Comment: Performed at [facility], [place] [address] [place], [place] [address]  Blood Culture (routine x 2)     Status: None (Preliminary result)   Collection Time: 09/18/19  5:00 PM   Specimen: BLOOD  Result Value Ref Range Status   Specimen Description BLOOD RIGHT ANTECUBITAL  Final   Special Requests   Final    BOTTLES DRAWN AEROBIC AND ANAEROBIC Blood Culture results may not be optimal due to an inadequate volume of blood received in culture bottles   Culture   Final    NO GROWTH < 24 HOURS Performed at [facility], [place] [address], [place], [place] [address]    Report Status PENDING  Incomplete  Blood Culture (  routine x 2)     Status: None (Preliminary result)   Collection Time: 09/18/19  6:40 PM   Specimen: BLOOD  Result Value Ref Range Status   Specimen Description BLOOD LEFT ANTECUBITAL  Final   Special Requests   Final    BOTTLES DRAWN AEROBIC AND ANAEROBIC Blood Culture adequate volume   Culture   Final    NO GROWTH < 24 HOURS Performed at [facility], [place] [address], [place], [place] [address]    Report Status PENDING  Incomplete    Procedures and diagnostic studies:  DG Chest Port 1 View  Result Date: 09/18/2019 CLINICAL DATA:  Hypoxia.  Weakness. EXAM: PORTABLE CHEST 1 VIEW COMPARISON:  09/06/2016. FINDINGS: There are hazy bilateral peripheral airspace lung opacities, new since the prior exam. No pleural effusion or pneumothorax. Cardiac silhouette is top in size. Aorta is tortuous. No mediastinal or hilar masses. Skeletal structures are grossly intact. IMPRESSION: 1. Hazy bilateral peripheral airspace lung opacities consistent with multifocal pneumonia. The pattern is suspicious for COVID-19 infection Electronically Signed   By: Lajean Manes M.D.   On: 09/18/2019 17:24    Medications:   . aspirin  81 mg Oral Daily  . dexamethasone (DECADRON) injection  6 mg Intravenous Q24H  .  insulin aspart  0-9 Units Subcutaneous Q4H  . losartan  50 mg Oral Daily  . pantoprazole  40 mg Oral Daily  . simvastatin  40 mg Oral q1800  . sodium chloride flush  3 mL Intravenous Q12H  . sodium chloride flush  3 mL Intravenous Q12H   Continuous Infusions: . sodium chloride    . heparin 850 Units/hr (09/19/19 0135)  . remdesivir 100 mg in NS 100 mL 100 mg (09/19/19 1106)     LOS: 0 days   Tabbetha Kutscher  [facility]   *Please refer to [email or web address], password [email or web address] to get updated schedule on who will round on this patient, as hospitalists switch teams weekly. If 7PM-7AM, please contact night-coverage at [email or web address], password [email or web address] for any overnight needs.  09/19/2019, 12:00 PM

## 2019-09-19 NOTE — Consult Note (Signed)
Cardiology Consultation:   Patient ID: Jacqueline Barnett; [id number]; 06/20/1946   Admit date: 09/18/2019 Date of Consult: 09/19/2019  Primary Care Provider: Wenda Low, MD Primary Cardiologist: New to [place] Primary Electrophysiologist:  None    Patient Profile:   Jacqueline Barnett is a 73 y.o. female with a PMH of HTN, HLD, DM type 2, COPD, and tobacco abuse who is being seen today for the evaluation of an abnormal EKG at the request of Dr. Roel Cluck.  History of Present Illness:   Ms. Lowing was in her usual state of health until 09/14/2019 when she began having HA and lethargy. She initially presented to UC and was found to be positive for COVID-19. She continued to feel poorly, with decreased po intake and generalized weakness, prompting her to present to the ED for further evaluation.   She has no known cardiac history and has never undergone an echocardiogram or an ischemic evaluation. On chart review, patient denied chest pain/pressure, syncope/pre-syncope, palpitations, dizziness, or lightheadedness.   ED course: tachypneic, intermittently hypoxic to the 80s, otherwise VSS. Labs notable for electrolytes wnl, glucose elevated to 200s, Cr 1.37>1.43, Anion gap 16, Hgb 12.3>10.8, PLT 340,  A1C 8.2, HsTrop 24>27>32, procal 0.19, with elevated LDH, Triglycerides, Ferritin, CRP, fibrinogen, and Ddimer. CXR with hazy bilateral opacities c/w multifocal PNA and COVID-19 infection. EKG with sinus rhythm with PAC, rate 87 bpm, QTC 511, nearly diffuse deep TWI, no STE/D (no comparison). Cardiology was curbsided overnight with recommendations for heparin gtt and echo. She was started on remdesivir and supportive care for COVID-19. Cardiology asked to evaluate for abnormal EKG.   Past Medical History:  Diagnosis Date  . Diabetes mellitus without complication ([place])   . Hypercholesteremia   . Hypertension     Past Surgical History:  Procedure Laterality Date  .  ABDOMINAL HYSTERECTOMY    . FOOT SURGERY       Home Medications:  Prior to Admission medications   Medication Sig Start Date End Date Taking? Authorizing Provider  aspirin 81 MG chewable tablet Chew by mouth daily.   Yes [provider]  famotidine (PEPCID) 20 MG tablet Take 20 mg by mouth 2 (two) times daily. 05/28/19  Yes [provider]  FARXIGA 5 MG TABS tablet Take 5 mg by mouth daily. 09/06/19  Yes [provider]  hydrochlorothiazide (HYDRODIURIL) 25 MG tablet Take 25 mg by mouth daily.   Yes [provider]  losartan (COZAAR) 50 MG tablet Take 50 mg by mouth daily.   Yes [provider]  metFORMIN (GLUCOPHAGE-XR) 500 MG 24 hr tablet Take 1,500 mg by mouth daily with breakfast.   Yes [provider]  Multiple Vitamin (MULTIVITAMIN) capsule Take 1 capsule by mouth daily.   Yes [provider]  naproxen (NAPROSYN) 375 MG tablet Take 1 tablet (375 mg total) by mouth 2 (two) times daily. 09/14/19  Yes Wieters, Hallie C, PA-C  simvastatin (ZOCOR) 40 MG tablet Take 40 mg by mouth daily at 6 PM.  10/12/16  Yes [provider]  ACCU-CHEK SMARTVIEW test strip  08/27/19   [provider]  diclofenac (VOLTAREN) 75 MG EC tablet Take 1 tablet (75 mg total) by mouth 2 (two) times daily. Patient not taking: Reported on 09/19/2019 02/25/17   Leandrew Koyanagi, MD  diclofenac (VOLTAREN) 75 MG EC tablet Take 1 tablet (75 mg total) by mouth 2 (two) times daily. Patient not taking: Reported on 09/19/2019 09/05/17   Leandrew Koyanagi, MD  diclofenac sodium (VOLTAREN) 1 % GEL Apply 2 g topically 4 (four) times daily. Patient not taking: Reported on 09/19/2019 09/05/17   Leandrew Koyanagi, MD  guaiFENesin-codeine [facility]) 100-10 MG/5ML syrup Take 10 mLs by mouth 4 (four) times daily as needed for cough. Patient not taking: Reported on 09/19/2019 09/06/16   Billy Fischer, MD  ipratropium (ATROVENT) 0.06 % nasal spray Place 2 sprays into  both nostrils 4 (four) times daily. Patient not taking: Reported on 09/19/2019 09/06/16   Billy Fischer, MD  levofloxacin (LEVAQUIN) 500 MG tablet Take 1 tablet (500 mg total) by mouth daily. Patient not taking: Reported on 09/19/2019 09/06/16   Billy Fischer, MD  meloxicam (MOBIC) 7.5 MG tablet Take 1 tablet (7.5 mg total) by mouth 2 (two) times daily as needed for pain. Patient not taking: Reported on 09/19/2019 02/01/17   Leandrew Koyanagi, MD  METFORMIN HCL PO Take by mouth.    [provider]  Omeprazole (PRILOSEC PO) Take by mouth.    [provider]  traMADol (ULTRAM) 50 MG tablet Take 1 tablet (50 mg total) by mouth every 6 (six) hours as needed. Patient not taking: Reported on 09/19/2019 03/08/16   Billy Fischer, MD    Inpatient Medications: Scheduled Meds: . aspirin  81 mg Oral Daily  . dexamethasone (DECADRON) injection  6 mg Intravenous Q24H  . insulin aspart  0-9 Units Subcutaneous Q4H  . losartan  50 mg Oral Daily  . pantoprazole  40 mg Oral Daily  . simvastatin  40 mg Oral q1800  . sodium chloride flush  3 mL Intravenous Q12H  . sodium chloride flush  3 mL Intravenous Q12H   Continuous Infusions: . sodium chloride    . heparin 850 Units/hr (09/19/19 0135)  . remdesivir 100 mg in NS 100 mL     PRN Meds: sodium chloride, acetaminophen, guaiFENesin-dextromethorphan, HYDROcodone-acetaminophen, sodium chloride flush, traMADol  Allergies:   No Known Allergies  Social History:   Social History   Socioeconomic History  . Marital status: Widowed    Spouse name: Not on file  . Number of children: Not on file  . Years of education: Not on file  . Highest education level: Not on file  Occupational History  . Not on file  Tobacco Use  . Smoking status: Current Every Day Smoker  . Smokeless tobacco: Never Used  Substance and Sexual Activity  . Alcohol use: No  . Drug use: Not on file  . Sexual activity: Not on file  Other Topics Concern  . Not on file    Social History Narrative  . Not on file   Social Determinants of Health   Financial Resource Strain:   . Difficulty of Paying Living Expenses: Not on file  Food Insecurity:   . Worried About Charity fundraiser in the Last Year: Not on file  . Ran Out of Food in the Last Year: Not on file  Transportation Needs:   . Lack of Transportation (Medical): Not on file  . Lack of Transportation (Non-Medical): Not on file  Physical Activity:   . Days of Exercise per Week: Not on file  . Minutes of Exercise per Session: Not on file  Stress:   . Feeling of Stress : Not on file  Social Connections:   . Frequency of Communication with Friends and Family: Not on file  . Frequency of Social Gatherings with Friends and Family: Not on file  . Attends Religious Services:  Not on file  . Active Member of Clubs or Organizations: Not on file  . Attends Archivist Meetings: Not on file  . Marital Status: Not on file  Intimate Partner Violence:   . Fear of Current or Ex-Partner: Not on file  . Emotionally Abused: Not on file  . Physically Abused: Not on file  . Sexually Abused: Not on file    Family History:    Family History  Problem Relation Age of Onset  . Diabetes Other      ROS:  Please see the history of present illness.  ROS  All other ROS reviewed and negative.     Physical Exam/Data:   Vitals:   09/19/19 0630 09/19/19 0645 09/19/19 0700 09/19/19 0800  BP:   108/60 113/64  Pulse: 65 64 66 64  Resp: (!) 25 (!) 30  (!) 31  Temp:      SpO2: 91% 92% 92% 94%  Weight:      Height:       No intake or output data in the 24 hours ending 09/19/19 0907 Filed Weights   09/18/19 1600  Weight: 72.6 kg   Body mass index is 28.34 kg/m.   Physical exam per MD below   EKG:  The EKG was personally reviewed and demonstrates:  sinus rhythm with PAC, rate 87 bpm, QTC 511, nearly diffuse deep TWI, no STE/D (no comparison).  Relevant CV Studies: None  Laboratory  Data:  Chemistry Recent Labs  Lab 09/18/19 1644 09/19/19 0043  NA 138 138  K 4.3 4.1  CL 95* 100  CO2 27 22  GLUCOSE 250* 264*  BUN 36* 38*  CREATININE 1.37* 1.43*  CALCIUM 9.2 8.6*  GFRNONAA 38* 36*  GFRAA 44* 42*  ANIONGAP 16* 16*    Recent Labs  Lab 09/18/19 1644 09/19/19 0043  PROT 7.7 6.7  ALBUMIN 3.2* 2.7*  AST 35 28  ALT 17 17  ALKPHOS 63 53  BILITOT 0.5 0.6   Hematology Recent Labs  Lab 09/18/19 1644 09/19/19 0043  WBC 6.5 6.8  RBC 4.21 3.70*  HGB 12.3 10.8*  HCT 37.4 33.4*  MCV 88.8 90.3  MCH 29.2 29.2  MCHC 32.9 32.3  RDW 12.9 12.9  PLT 340 321   Cardiac EnzymesNo results for input(s): TROPONINI in the last 168 hours. No results for input(s): TROPIPOC in the last 168 hours.  BNPNo results for input(s): BNP, PROBNP in the last 168 hours.  DDimer  Recent Labs  Lab 09/18/19 1644 09/19/19 0506  DDIMER 1.54* 1.47*    Radiology/Studies:  DG Chest Port 1 View  Result Date: 09/18/2019 CLINICAL DATA:  Hypoxia.  Weakness. EXAM: PORTABLE CHEST 1 VIEW COMPARISON:  09/06/2016. FINDINGS: There are hazy bilateral peripheral airspace lung opacities, new since the prior exam. No pleural effusion or pneumothorax. Cardiac silhouette is top in size. Aorta is tortuous. No mediastinal or hilar masses. Skeletal structures are grossly intact. IMPRESSION: 1. Hazy bilateral peripheral airspace lung opacities consistent with multifocal pneumonia. The pattern is suspicious for COVID-19 infection Electronically Signed   By: Lajean Manes M.D.   On: 09/18/2019 17:24    Assessment and Plan:   1. Abnormal EKG in the setting of COVID-19 infection: patient presented with progressive weakness and poor po intake in the setting of recently diagnosed COVID-19. She had no complaints of chest pain, though was mildly tachypneic/hypoxic on arrival. EKG revealed nearly diffuse TWI without STE/D and no comparison. HsTrop is mildly elevated with flat trend not  c/w ACS. Risk factors for  CAD include HTN, HLD, DM type 2, age, tobacco abuse, and evidence of aortic atherosclerosis on prior CT chest.  - Echo pending to evaluate LV function and wall motion - if abnormal, will likely need an ischemic evaluation - Can continue heparin gtt x48 hour for conservative management.  - Continue aspirin 81mg  daily - Continue home simvastatin - Continue supportive care and remdisivir per primary team for management of COVID-19 infection  2. HTN: BP stable. Home HCTZ held pending improvement in Cr.  - Continue home losartan   3. HLD: no recent lipids on file - Will check a FLP for risk stratification in AM. - Continue home simvastatin  4. Prolonged QT: QTc 511 on EKG this admission; no comparison. Electrolyes are stable - Continue to monitor closely and avoid QT prolonging medications.    5. DM type 2: A1C 8.2 this admission; goal <7 - Continue management per primary team  6. AKI vs CKD stage 3: Cr 1.37>1.43 on labs this admission, no baseline available to review.  - Continue management per primary team  Remainder of plan pending MD evaluation.    For questions or updates, please contact [place] Please consult [email or web address] for contact info under Cardiology/STEMI.   Signed, Abigail Butts, PA-C  09/19/2019 9:07 AM [phone]  Patient examined chart reviewed. Sick for about a week No cardiac history of CAD or CHF. No chest pain Diffuse T wave inversions precordium with no old ECG to compare.  Currently sats ok inspiratory crackles mid / base no heart murmur no edema. Differential includes non obstructive HOCM., myocarditis and ischemic event. I think these are less likely with no murmur and very Barnett troponin with no delta. Stress induced DCM like Takatsubo more likely. Agree with echo today despite COVID positive status If EF is Barnett would need to potentially stay at [facility] and not be transferred to [facility] for monitory   Jenkins [place]

## 2019-09-20 DIAGNOSIS — E1165 Type 2 diabetes mellitus with hyperglycemia: Secondary | ICD-10-CM

## 2019-09-20 LAB — CBC WITH DIFFERENTIAL/PLATELET
Abs Immature Granulocytes: 0.14 10*3/uL — ABNORMAL HIGH (ref 0.00–0.07)
Basophils Absolute: 0 10*3/uL (ref 0.0–0.1)
Basophils Relative: 0 %
Eosinophils Absolute: 0 10*3/uL (ref 0.0–0.5)
Eosinophils Relative: 0 %
HCT: 34.6 % — ABNORMAL LOW (ref 36.0–46.0)
Hemoglobin: 11.4 g/dL — ABNORMAL LOW (ref 12.0–15.0)
Immature Granulocytes: 2 %
Lymphocytes Relative: 11 %
Lymphs Abs: 0.8 10*3/uL (ref 0.7–4.0)
MCH: 29.5 pg (ref 26.0–34.0)
MCHC: 32.9 g/dL (ref 30.0–36.0)
MCV: 89.4 fL (ref 80.0–100.0)
Monocytes Absolute: 0.4 10*3/uL (ref 0.1–1.0)
Monocytes Relative: 5 %
Neutro Abs: 6.5 10*3/uL (ref 1.7–7.7)
Neutrophils Relative %: 82 %
Platelets: 441 10*3/uL — ABNORMAL HIGH (ref 150–400)
RBC: 3.87 MIL/uL (ref 3.87–5.11)
RDW: 13.1 % (ref 11.5–15.5)
WBC: 7.9 10*3/uL (ref 4.0–10.5)
nRBC: 0 % (ref 0.0–0.2)

## 2019-09-20 LAB — COMPREHENSIVE METABOLIC PANEL
ALT: 19 U/L (ref 0–44)
AST: 26 U/L (ref 15–41)
Albumin: 2.9 g/dL — ABNORMAL LOW (ref 3.5–5.0)
Alkaline Phosphatase: 64 U/L (ref 38–126)
Anion gap: 14 (ref 5–15)
BUN: 43 mg/dL — ABNORMAL HIGH (ref 8–23)
CO2: 25 mmol/L (ref 22–32)
Calcium: 9.2 mg/dL (ref 8.9–10.3)
Chloride: 99 mmol/L (ref 98–111)
Creatinine, Ser: 1.34 mg/dL — ABNORMAL HIGH (ref 0.44–1.00)
GFR calc Af Amer: 45 mL/min — ABNORMAL LOW (ref >60)
GFR calc non Af Amer: 39 mL/min — ABNORMAL LOW (ref >60)
Glucose, Bld: 306 mg/dL — ABNORMAL HIGH (ref 70–99)
Potassium: 4.4 mmol/L (ref 3.5–5.1)
Sodium: 138 mmol/L (ref 135–145)
Total Bilirubin: 0.3 mg/dL (ref 0.3–1.2)
Total Protein: 7.3 g/dL (ref 6.5–8.1)

## 2019-09-20 LAB — D-DIMER, QUANTITATIVE: D-Dimer, Quant: 1.15 ug/mL-FEU — ABNORMAL HIGH (ref 0.00–0.50)

## 2019-09-20 LAB — GLUCOSE, CAPILLARY
Glucose-Capillary: 154 mg/dL — ABNORMAL HIGH (ref 70–99)
Glucose-Capillary: 156 mg/dL — ABNORMAL HIGH (ref 70–99)
Glucose-Capillary: 219 mg/dL — ABNORMAL HIGH (ref 70–99)
Glucose-Capillary: 245 mg/dL — ABNORMAL HIGH (ref 70–99)
Glucose-Capillary: 247 mg/dL — ABNORMAL HIGH (ref 70–99)
Glucose-Capillary: 283 mg/dL — ABNORMAL HIGH (ref 70–99)

## 2019-09-20 LAB — MAGNESIUM: Magnesium: 2.1 mg/dL (ref 1.7–2.4)

## 2019-09-20 LAB — LIPID PANEL
Cholesterol: 138 mg/dL (ref 0–200)
HDL: 54 mg/dL (ref >40)
LDL Cholesterol: 66 mg/dL (ref 0–99)
Total CHOL/HDL Ratio: 2.6 RATIO
Triglycerides: 88 mg/dL (ref <150)
VLDL: 18 mg/dL (ref 0–40)

## 2019-09-20 LAB — HEPARIN LEVEL (UNFRACTIONATED): Heparin Unfractionated: 0.46 IU/mL (ref 0.30–0.70)

## 2019-09-20 LAB — C-REACTIVE PROTEIN: CRP: 19.3 mg/dL — ABNORMAL HIGH (ref <1.0)

## 2019-09-20 LAB — FERRITIN: Ferritin: 865 ng/mL — ABNORMAL HIGH (ref 11–307)

## 2019-09-20 MED ORDER — INSULIN GLARGINE 100 UNIT/ML ~~LOC~~ SOLN
12.0000 [IU] | Freq: Every day | SUBCUTANEOUS | Status: DC
  Administered 2019-09-20 – 2019-09-21 (×2): 12 [IU] via SUBCUTANEOUS
  Filled 2019-09-20 (×2): qty 0.12

## 2019-09-20 NOTE — Progress Notes (Addendum)
Progress Note    Jacqueline Barnett  [id number] DOB: 1945/10/19  DOA: 09/18/2019 PCP: Wenda Low, MD      Brief Narrative:    Medical records reviewed and are as summarized below:  Jacqueline Barnett is an 73 y.o. female with medical history significant for hypertension, type 2 diabetes mellitus, probable CKD stage III, was referred from the urgent care clinic to the emergency room because of positive coronavirus test.  She complained of generalized weakness.  In the ED, her oxygen saturation was 88% on room air.  X-ray showed multifocal pneumonia due to coronavirus infection.      Assessment/Plan:   Principal Problem:   Pneumonia due to COVID-19 virus Active Problems:   DM2 (diabetes mellitus, type 2) (HCC)   Abnormal ECG   HTN (hypertension)   Elevated troponin   Prolonged QT interval   Acute hypoxemic respiratory failure (HCC)   Body mass index is 28.32 kg/m.   COVID-19 pneumonia: Continue IV Remdesivir and IV dexamethasone.  Monitor inflammatory markers.  Acute hypoxemic respiratory failure: She is on 2 L/min oxygen via nasal cannula.  Taper off as able.  Abnormal EKG (Diffuse T wave inversion, QTC prolongation)/mildly elevated troponin: Repeat EKG today still shows diffuse T wave inversion but improvement in prolonged QTc interval (from 511 to 483).  2D echo showed EF 55 to [id number], grade 1 diastolic dysfunction, no LVH.  Continue IV heparin and monitor PTT per protocol.  Cardiologist recommended discontinuation of IV heparin infusion tomorrow morning.  Continue aspirin and simvastatin.    Type 2 diabetes mellitus with hyperglycemia: Hemoglobin A1c is 8.2.  NovoLog as needed for hyperglycemia.  Continue Lantus.  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   Family Communication/Anticipated D/C date and plan/Code Status   DVT prophylaxis: Heparin Code Status:  Full code Family Communication: Plan discussed with the patient Disposition Plan: To be determined      Subjective:   Breathing is better today.  No chest pain, cough or fever.  Objective:    Vitals:   09/20/19 0500 09/20/19 0700 09/20/19 0800 09/20/19 1400  BP:   128/71 (!) 145/87  Pulse: 62 (!) 58 61   Resp: (!) 26 (!) 27 (!) 28 20  Temp:    98.2 F (36.8 C)  TempSrc:    Oral  SpO2: 93% 94% 94% 99%  Weight:      Height:        Intake/Output Summary (Last 24 hours) at 09/20/2019 1449 Last data filed at 09/20/2019 1000 Gross per 24 hour  Intake 542.55 ml  Output --  Net 542.55 ml   Filed Weights   09/18/19 1600 09/19/19 2330 09/20/19 0100  Weight: 72.6 kg 72.5 kg 72.5 kg    Exam:  GEN: NAD SKIN: No rash EYES: Anicteric ENT: MMM CV: RRR PULM: Air entry adequate bilaterally, no wheezing heard but she has bibasilar rales. ABD: soft, ND, NT, +BS CNS: AAO x 3, non focal EXT: No edema or tenderness    Data Reviewed:   I have personally reviewed following labs and imaging studies:  Labs: Labs show the following:   Basic Metabolic Panel: Recent Labs  Lab 09/18/19 1644 09/19/19 0043 09/20/19 0146  NA 138 138 138  K 4.3 4.1 4.4  CL 95* 100 99  CO2 27 22 25   GLUCOSE 250* 264* 306*  BUN 36* 38* 43*  CREATININE 1.37* 1.43* 1.34*  CALCIUM 9.2 8.6* 9.2  MG  --  2.1 2.1   GFR Estimated Creatinine Clearance: 35.1 mL/min (A) (by C-G formula based on SCr of 1.34 mg/dL (H)). Liver Function Tests: Recent Labs  Lab 09/18/19 1644 09/19/19 0043 09/20/19 0146  AST 35 28 26  ALT 17 17 19   ALKPHOS 63 53 64  BILITOT 0.5 0.6 0.3  PROT 7.7 6.7 7.3  ALBUMIN 3.2* 2.7* 2.9*   No results for input(s): LIPASE, AMYLASE in the last 168 hours. No results for input(s): AMMONIA in the last 168 hours. Coagulation profile No results for input(s): INR, PROTIME in the last 168 hours.  CBC: Recent Labs  Lab 09/18/19 1644 09/19/19 0043 09/20/19 0146  WBC 6.5  6.8 7.9  NEUTROABS 5.7 6.1 6.5  HGB 12.3 10.8* 11.4*  HCT 37.4 33.4* 34.6*  MCV 88.8 90.3 89.4  PLT 340 321 441*   Cardiac Enzymes: No results for input(s): CKTOTAL, CKMB, CKMBINDEX, TROPONINI in the last 168 hours. BNP (last 3 results) No results for input(s): PROBNP in the last 8760 hours. CBG: Recent Labs  Lab 09/19/19 1938 09/19/19 2204 09/20/19 0343 09/20/19 0807 09/20/19 1156  GLUCAP 259* 217* 247* 245* 283*   D-Dimer: Recent Labs    09/19/19 0506 09/20/19 0146  DDIMER 1.47* 1.15*   Hgb A1c: Recent Labs    09/18/19 2100  HGBA1C 8.2*   Lipid Profile: Recent Labs    09/18/19 1644 09/20/19 0146  CHOL  --  138  HDL  --  54  LDLCALC  --  66  TRIG 202* 88  CHOLHDL  --  2.6   Thyroid function studies: No results for input(s): TSH, T4TOTAL, T3FREE, THYROIDAB in the last 72 hours.  Invalid input(s): FREET3 Anemia work up: Recent Labs    09/19/19 0043 09/20/19 0146  FERRITIN 593* 865*   Sepsis Labs: Recent Labs  Lab 09/18/19 1644 09/19/19 0043 09/20/19 0146  PROCALCITON 0.19  --   --   WBC 6.5 6.8 7.9  LATICACIDVEN 1.8  --   --     Microbiology Recent Results (from the past 240 hour(s))  Novel Coronavirus, NAA (Hosp order, Send-out to Ref Lab; TAT 18-24 hrs     Status: Abnormal   Collection Time: 09/14/19  1:17 PM   Specimen: Nasopharyngeal Swab; Respiratory  Result Value Ref Range Status   SARS-CoV-2, NAA DETECTED (A) NOT DETECTED Final    Comment: (NOTE)                  Client Requested Flag This nucleic acid amplification test was developed and its performance characteristics determined by [employer]. Nucleic acid amplification tests include PCR and TMA. This test has not been FDA cleared or approved. This test has been authorized by FDA under an Emergency Use Authorization (EUA). This test is only authorized for the duration of time the declaration that circumstances exist justifying the authorization of the emergency  use of in vitro diagnostic tests for detection of SARS-CoV-2 virus and/or diagnosis of COVID-19 infection under section 564(b)(1) of the Act, 21 U.S.C. [id number]) (1), unless the authorization is terminated or revoked sooner. When diagnostic testing is negative, the possibility of a false negative result should be considered in the context of a patient's recent exposures and the presence of clinical signs and symptoms consistent with COVID-19. An individual without symptoms of COVID-  19 and who is not shedding SARS-CoV-2 virus would expect to have a negative (not detected) result in this assay. Performed At: [facility] [address] [place], [place] [id number] Rush Farmer MD [id number]    Coronavirus Source NASOPHARYNGEAL  Final    Comment: Performed at [facility], [place] [address] [place], [place] [address]  Blood Culture (routine x 2)     Status: None (Preliminary result)   Collection Time: 09/18/19  5:00 PM   Specimen: BLOOD  Result Value Ref Range Status   Specimen Description BLOOD RIGHT ANTECUBITAL  Final   Special Requests   Final    BOTTLES DRAWN AEROBIC AND ANAEROBIC Blood Culture results may not be optimal due to an inadequate volume of blood received in culture bottles   Culture   Final    NO GROWTH 2 DAYS Performed at [facility], [place] [address] [place], [place] [address]    Report Status PENDING  Incomplete  Blood Culture (routine x 2)     Status: None (Preliminary result)   Collection Time: 09/18/19  6:40 PM   Specimen: BLOOD  Result Value Ref Range Status   Specimen Description BLOOD LEFT ANTECUBITAL  Final   Special Requests   Final    BOTTLES DRAWN AEROBIC AND ANAEROBIC Blood Culture adequate volume   Culture   Final    NO GROWTH 2 DAYS Performed at [facility], [place] [address], [place], [place] [address]    Report Status PENDING  Incomplete    Procedures and diagnostic studies:  DG Chest Port 1 View  Result Date:  09/18/2019 CLINICAL DATA:  Hypoxia.  Weakness. EXAM: PORTABLE CHEST 1 VIEW COMPARISON:  09/06/2016. FINDINGS: There are hazy bilateral peripheral airspace lung opacities, new since the prior exam. No pleural effusion or pneumothorax. Cardiac silhouette is top in size. Aorta is tortuous. No mediastinal or hilar masses. Skeletal structures are grossly intact. IMPRESSION: 1. Hazy bilateral peripheral airspace lung opacities consistent with multifocal pneumonia. The pattern is suspicious for COVID-19 infection Electronically Signed   By: Lajean Manes M.D.   On: 09/18/2019 17:24   ECHOCARDIOGRAM COMPLETE  Result Date: 09/19/2019   ECHOCARDIOGRAM REPORT   Patient Name:   [facility] Date of Exam: 09/19/2019 Medical Rec #:  [id number]              Height:       63.0 in Accession #:    [id number]             Weight:       160.0 lb Date of Birth:  1946-08-14               BSA:          1.76 m Patient Age:    57 years               BP:           111/74 mmHg Patient Gender: F                      HR:           70 bpm. Exam Location:  Inpatient Procedure: 2D Echo                             MODIFIED REPORT: This report was modified by Eleonore Chiquito MD on 09/19/2019 due to error.  Indications:     Abnormal ECG R94.31  History:         Patient has no prior history of Echocardiogram examinations.  Covid-19 Positive; Risk Factors:Hypertension and Diabetes.  Sonographer:     Mikki Santee RDCS (AE) Referring Phys:  [place] Diagnosing Phys: Eleonore Chiquito MD IMPRESSIONS  1. Left ventricular ejection fraction, by visual estimation, is 55 to 60%. The left ventricle has normal function. There is no left ventricular hypertrophy.  2. Left ventricular diastolic parameters are consistent with Grade I diastolic dysfunction (impaired relaxation).  3. The left ventricle has no regional wall motion abnormalities.  4. Global right ventricle has normal systolic function.The right ventricular size  is normal. No increase in right ventricular wall thickness.  5. Left atrial size was normal.  6. Right atrial size was normal.  7. Presence of pericardial fat pad.  8. Trivial pericardial effusion is present.  9. Mild mitral annular calcification. 10. The mitral valve is degenerative. Trivial mitral valve regurgitation. 11. The tricuspid valve is grossly normal. Tricuspid valve regurgitation is trivial. 12. The aortic valve is tricuspid. Aortic valve regurgitation is not visualized. No evidence of aortic valve sclerosis or stenosis. 13. The pulmonic valve was normal in structure. Pulmonic valve regurgitation is not visualized. 14. Normal pulmonary artery systolic pressure. 15. The inferior vena cava is normal in size with greater than 50% respiratory variability, suggesting right atrial pressure of 3 mmHg. 16. No prior Echocardiogram. FINDINGS  Left Ventricle: Left ventricular ejection fraction, by visual estimation, is 55 to 60%. The left ventricle has normal function. The left ventricle has no regional wall motion abnormalities. The left ventricular internal cavity size was the left ventricle is normal in size. There is no left ventricular hypertrophy. Left ventricular diastolic parameters are consistent with Grade I diastolic dysfunction (impaired relaxation). Normal left atrial pressure. Right Ventricle: The right ventricular size is normal. No increase in right ventricular wall thickness. Global RV systolic function is has normal systolic function. The tricuspid regurgitant velocity is 1.96 m/s, and with an assumed right atrial pressure  of 3 mmHg, the estimated right ventricular systolic pressure is normal at 18.4 mmHg. Left Atrium: Left atrial size was normal in size. Right Atrium: Right atrial size was normal in size Pericardium: Trivial pericardial effusion is present. Presence of pericardial fat pad. Mitral Valve: The mitral valve is degenerative in appearance. Mild mitral annular calcification. Trivial  mitral valve regurgitation. Tricuspid Valve: The tricuspid valve is grossly normal. Tricuspid valve regurgitation is trivial. Aortic Valve: The aortic valve is tricuspid. Aortic valve regurgitation is not visualized. The aortic valve is structurally normal, with no evidence of sclerosis or stenosis. Pulmonic Valve: The pulmonic valve was normal in structure. Pulmonic valve regurgitation is not visualized. Pulmonic regurgitation is not visualized. Aorta: The aortic root is normal in size and structure. Venous: The inferior vena cava is normal in size with greater than 50% respiratory variability, suggesting right atrial pressure of 3 mmHg. IAS/Shunts: No atrial level shunt detected by color flow Doppler.  LEFT VENTRICLE PLAX 2D LVIDd:         4.09 cm  Diastology LVIDs:         2.57 cm  LV e' lateral:   5.66 cm/s LV PW:         0.94 cm  LV E/e' lateral: 13.2 LV IVS:        0.78 cm  LV e' medial:    6.20 cm/s LVOT diam:     2.20 cm  LV E/e' medial:  12.0 LV SV:         50 ml LV SV Index:   27.45 LVOT Area:  3.80 cm  RIGHT VENTRICLE RV S prime:     11.50 cm/s TAPSE (M-mode): 2.0 cm LEFT ATRIUM             Index       RIGHT ATRIUM          Index LA diam:        3.70 cm 2.10 cm/m  RA Area:     9.51 cm LA Vol (A2C):   38.3 ml 21.78 ml/m RA Volume:   16.10 ml 9.15 ml/m LA Vol (A4C):   43.2 ml 24.56 ml/m LA Biplane Vol: 40.9 ml 23.26 ml/m   AORTA Ao Root diam: 2.90 cm MITRAL VALVE                        TRICUSPID VALVE MV Area (PHT): 2.73 cm             TR Peak grad:   15.4 mmHg MV PHT:        80.62 msec           TR Vmax:        196.00 cm/s MV Decel Time: 278 msec MV E velocity: 74.60 cm/s 103 cm/s  SHUNTS MV A velocity: 98.10 cm/s 70.3 cm/s Systemic Diam: 2.20 cm MV E/A ratio:  0.76       1.5  Eleonore Chiquito MD Electronically signed by Eleonore Chiquito MD Signature Date/Time: 09/19/2019/4:03:51 PM    Final (Updated)     Medications:   . aspirin  81 mg Oral Daily  . dexamethasone (DECADRON) injection  6 mg  Intravenous Q24H  . insulin aspart  0-9 Units Subcutaneous Q4H  . insulin glargine  12 Units Subcutaneous Daily  . losartan  50 mg Oral Daily  . pantoprazole  40 mg Oral Daily  . simvastatin  40 mg Oral q1800  . sodium chloride flush  3 mL Intravenous Q12H  . sodium chloride flush  3 mL Intravenous Q12H   Continuous Infusions: . sodium chloride    . heparin 850 Units/hr (09/20/19 0855)  . remdesivir 100 mg in NS 100 mL 100 mg (09/20/19 0830)     LOS: 1 day   Aaryn Parrilla  [facility]   *Please refer to [email or web address], password [email or web address] to get updated schedule on who will round on this patient, as hospitalists switch teams weekly. If 7PM-7AM, please contact night-coverage at [email or web address], password [email or web address] for any overnight needs.  09/20/2019, 2:49 PM

## 2019-09-20 NOTE — Progress Notes (Signed)
Patient admitted to 5w14, skin assessed, and verified tele. Placed patient back on heparin infusion. Oriented to the room, and call bell.

## 2019-09-20 NOTE — Progress Notes (Signed)
Reviewed echo EF normal EF 55-60%  No RWMAls  Trivial pericardial effusion  No anginal chest pain Etiology of abnormal ECG does not appear To be Takatsubo, myocarditis or acute ischemic event Troponin max only 32 with no delta Ok to stop heparin Friday morning  Continue daily am ECG   Jenkins Rouge

## 2019-09-20 NOTE — Progress Notes (Signed)
ANTICOAGULATION CONSULT NOTE - Follow Up Consult  Pharmacy Consult for Heparin Indication: chest pain/ACS  No Known Allergies  Patient Measurements: Height: 5\' 3"  (160 cm) Weight: 159 lb 14.4 oz (72.5 kg) IBW/kg (Calculated) : 52.4 Heparin Dosing Weight 68kg  Vital Signs: Temp: 99.2 F (37.3 C) (12/17 0157) Temp Source: Oral (12/17 0157) BP: 133/68 (12/16 2200) Pulse Rate: 74 (12/17 0100)  Labs: Recent Labs    09/18/19 1644 09/18/19 1822 09/18/19 2100 09/18/19 2300 09/19/19 0043 09/19/19 1722 09/20/19 0000 09/20/19 0146  HGB 12.3  --   --   --  10.8*  --   --  11.4*  HCT 37.4  --   --   --  33.4*  --   --  34.6*  PLT 340  --   --   --  321  --   --  441*  HEPARINUNFRC  --   --   --   --   --  0.53 0.46  --   CREATININE 1.37*  --   --   --  1.43*  --   --  1.34*  TROPONINIHS  --  24* 27* 32*  --   --   --   --     Estimated Creatinine Clearance: 35.1 mL/min (A) (by C-G formula based on SCr of 1.34 mg/dL (H)).   Medical History: Past Medical History:  Diagnosis Date  . Diabetes mellitus without complication ([place])   . Hypercholesteremia   . Hypertension    Assessment: 73 y.o. F presents with generalized weakness found to be COVID positive. Pt with EKG changes suggestive of diffuse ischemia per cardiology. Pharmacy consulted to dose heparin for ACS.  12/17 AM update:  Heparin level therapeutic x 2   Goal of Therapy:  Heparin level 0.3-0.7 units/ml Monitor platelets by anticoagulation protocol: Yes   Plan:  Continue heparin 850 units/hr  Monitor daily heparin level and CBC Monitor for bleeding   Narda Bonds, PharmD, BCPS Clinical Pharmacist Phone: [phone]

## 2019-09-21 ENCOUNTER — Telehealth: Payer: Self-pay

## 2019-09-21 DIAGNOSIS — R778 Other specified abnormalities of plasma proteins: Secondary | ICD-10-CM

## 2019-09-21 DIAGNOSIS — I514 Myocarditis, unspecified: Secondary | ICD-10-CM

## 2019-09-21 LAB — GLUCOSE, CAPILLARY
Glucose-Capillary: 215 mg/dL — ABNORMAL HIGH (ref 70–99)
Glucose-Capillary: 197 mg/dL — ABNORMAL HIGH (ref 70–99)
Glucose-Capillary: 198 mg/dL — ABNORMAL HIGH (ref 70–99)
Glucose-Capillary: 145 mg/dL — ABNORMAL HIGH (ref 70–99)
Glucose-Capillary: 108 mg/dL — ABNORMAL HIGH (ref 70–99)

## 2019-09-21 LAB — CBC WITH DIFFERENTIAL/PLATELET
Abs Immature Granulocytes: 0 10*3/uL (ref 0.00–0.07)
Basophils Absolute: 0 10*3/uL (ref 0.0–0.1)
Basophils Relative: 0 %
Eosinophils Absolute: 0 10*3/uL (ref 0.0–0.5)
Eosinophils Relative: 0 %
HCT: 38.2 % (ref 36.0–46.0)
Hemoglobin: 12.1 g/dL (ref 12.0–15.0)
Lymphocytes Relative: 3 %
Lymphs Abs: 0.2 10*3/uL — ABNORMAL LOW (ref 0.7–4.0)
MCH: 28.4 pg (ref 26.0–34.0)
MCHC: 31.7 g/dL (ref 30.0–36.0)
MCV: 89.7 fL (ref 80.0–100.0)
Monocytes Absolute: 0.6 10*3/uL (ref 0.1–1.0)
Monocytes Relative: 7 %
Neutro Abs: 7.1 10*3/uL (ref 1.7–7.7)
Neutrophils Relative %: 90 %
Platelets: 376 10*3/uL (ref 150–400)
RBC: 4.26 MIL/uL (ref 3.87–5.11)
RDW: 12.9 % (ref 11.5–15.5)
WBC: 7.9 10*3/uL (ref 4.0–10.5)
nRBC: 0 % (ref 0.0–0.2)
nRBC: 1 /100 WBC — ABNORMAL HIGH

## 2019-09-21 LAB — COMPREHENSIVE METABOLIC PANEL
ALT: 16 U/L (ref 0–44)
AST: 25 U/L (ref 15–41)
Albumin: 3 g/dL — ABNORMAL LOW (ref 3.5–5.0)
Alkaline Phosphatase: 64 U/L (ref 38–126)
Anion gap: 16 — ABNORMAL HIGH (ref 5–15)
BUN: 38 mg/dL — ABNORMAL HIGH (ref 8–23)
CO2: 20 mmol/L — ABNORMAL LOW (ref 22–32)
Calcium: 9.2 mg/dL (ref 8.9–10.3)
Chloride: 102 mmol/L (ref 98–111)
Creatinine, Ser: 1.16 mg/dL — ABNORMAL HIGH (ref 0.44–1.00)
GFR calc Af Amer: 54 mL/min — ABNORMAL LOW (ref >60)
GFR calc non Af Amer: 46 mL/min — ABNORMAL LOW (ref >60)
Glucose, Bld: 238 mg/dL — ABNORMAL HIGH (ref 70–99)
Potassium: 4.7 mmol/L (ref 3.5–5.1)
Sodium: 138 mmol/L (ref 135–145)
Total Bilirubin: 0.6 mg/dL (ref 0.3–1.2)
Total Protein: 6.9 g/dL (ref 6.5–8.1)

## 2019-09-21 LAB — HEPARIN LEVEL (UNFRACTIONATED): Heparin Unfractionated: 0.97 IU/mL — ABNORMAL HIGH (ref 0.30–0.70)

## 2019-09-21 LAB — FERRITIN: Ferritin: 852 ng/mL — ABNORMAL HIGH (ref 11–307)

## 2019-09-21 LAB — D-DIMER, QUANTITATIVE: D-Dimer, Quant: 0.82 ug/mL-FEU — ABNORMAL HIGH (ref 0.00–0.50)

## 2019-09-21 LAB — C-REACTIVE PROTEIN: CRP: 8.7 mg/dL — ABNORMAL HIGH (ref <1.0)

## 2019-09-21 LAB — MAGNESIUM: Magnesium: 1.9 mg/dL (ref 1.7–2.4)

## 2019-09-21 MED ORDER — ENOXAPARIN SODIUM 40 MG/0.4ML ~~LOC~~ SOLN
40.0000 mg | SUBCUTANEOUS | Status: DC
  Administered 2019-09-21 – 2019-09-24 (×4): 40 mg via SUBCUTANEOUS
  Filled 2019-09-21 (×4): qty 0.4

## 2019-09-21 MED ORDER — INSULIN GLARGINE 100 UNIT/ML ~~LOC~~ SOLN
14.0000 [IU] | Freq: Every day | SUBCUTANEOUS | Status: DC
  Administered 2019-09-22 – 2019-09-24 (×3): 14 [IU] via SUBCUTANEOUS
  Filled 2019-09-21 (×3): qty 0.14

## 2019-09-21 NOTE — Progress Notes (Addendum)
PROGRESS NOTE  Jacqueline Barnett [id number] DOB: 1946/04/01 DOA: 09/18/2019 PCP: Wenda Low, MD   LOS: 2 days   Brief narrative: Jacqueline Barnett is an 73 y.o. female with medical history significant for hypertension, type 2 diabetes mellitus, probable CKD stage III, was referred from the urgent care clinic to the emergency room because of positive coronavirus test.  She complained of generalized weakness.  In the ED, her oxygen saturation was 88% on room air.  X-ray showed multifocal pneumonia due to coronavirus infection.  Assessment/Plan:  Principal Problem:   Pneumonia due to COVID-19 virus Active Problems:   DM2 (diabetes mellitus, type 2) (HCC)   Abnormal ECG   HTN (hypertension)   Elevated troponin   Prolonged QT interval   Acute hypoxemic respiratory failure ([place])  COVID-19 pneumonia: Continue IV Remdesivir and IV dexamethasone.  Monitor inflammatory markers daily.  Continue supportive care.  Acute hypoxemic respiratory failure secondary to COVID-19 pneumonia: She is on 4 L/min oxygen via nasal cannula.  wean oxygen off as able.  Patient was on 5 L of oxygen yesterday.  Abnormal EKG (Diffuse T wave inversion, QTC prolongation)/mildly elevated troponin: Seen by cardiology  2D echo showed EF 55 to [id number], grade 1 diastolic dysfunction, no LVH.  Received IV heparin.  Will switch to Lovenox.  Continue aspirin and simvastatin.    Cardiology recommends outpatient follow-up with MRI in 2 to 3 weeks and follow-up with cardiology. Avoid QT prolonging drugs  Type 2 diabetes mellitus with hyperglycemia: Hemoglobin A1c is 8.2.    Continue sliding scale insulin, Continue Lantus, will increase the dose of Lantus to 14 units tonight.    Continue to hold Home Farxiga and Metformin .  Monitor closely on dexamethasone.   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  VTE Prophylaxis:   Lovenox  Code Status: Full code  Family Communication: unable to reach the patients' contact Ms Lawerance Sabal on the phone provided.  I however spoke with patient's other daughter Tawanna Sat on the phone and updated her about the clinical condition of the patient.  Disposition Plan: To be determined, will get PT and evaluation starting 09/22/2019.   Consultants:  Cardiology  Procedures:  None  Antibiotics: Anti-infectives (From admission, onward)   Start     Dose/Rate Route Frequency Ordered Stop   09/19/19 1000  remdesivir 100 mg in sodium chloride 0.9 % 100 mL IVPB     100 mg 200 mL/hr over 30 Minutes Intravenous Daily 09/18/19 2021 09/23/19 0959   09/18/19 2030  remdesivir 200 mg in sodium chloride 0.9% 250 mL IVPB     200 mg 580 mL/hr over 30 Minutes Intravenous Once 09/18/19 2021 09/18/19 2156      Subjective: Today, patient denies any chest pain, shortness of breath, fever or chills.  She does have mild cough with the sputum.  Objective: Vitals:   09/21/19 0000 09/21/19 0309  BP: 138/72 129/66  Pulse: 63 67  Resp: 18   Temp: 98.3 F (36.8 C) 97.7 F (36.5 C)  SpO2: 99%     Intake/Output Summary (Last 24 hours) at 09/21/2019 1151 Last data filed at 09/21/2019 0900 Gross per 24 hour  Intake 320 ml  Output 925 ml  Net -605 ml   Filed Weights   09/18/19 1600 09/19/19 2330 09/20/19 0100  Weight: 72.6 kg 72.5 kg 72.5 kg   Body mass index is 28.32 kg/m.   Physical Exam: GENERAL: Patient is alert awake and  oriented. Not in obvious distress.  On 4 L of oxygen by nasal cannula. HENT: No scleral pallor or icterus. Pupils equally reactive to light. Oral mucosa is moist NECK: is supple, no palpable thyroid enlargement. CHEST: Respirations noted diminished breath sounds bilaterally. CVS: S1 and S2 heard, no murmur. Regular rate and rhythm. No pericardial rub. ABDOMEN: Soft, non-tender, bowel sounds are present. No palpable hepato-splenomegaly. EXTREMITIES: No  edema. CNS: Cranial nerves are intact. No focal motor or sensory deficits. SKIN: warm and dry without rashes.  Data Review: I have personally reviewed the following laboratory data and studies,  CBC: Recent Labs  Lab 09/18/19 1644 09/19/19 0043 09/20/19 0146 09/21/19 0500  WBC 6.5 6.8 7.9 7.9  NEUTROABS 5.7 6.1 6.5 7.1  HGB 12.3 10.8* 11.4* 12.1  HCT 37.4 33.4* 34.6* 38.2  MCV 88.8 90.3 89.4 89.7  PLT 340 321 441* [id number]   Basic Metabolic Panel: Recent Labs  Lab 09/18/19 1644 09/19/19 0043 09/20/19 0146 09/21/19 0500  NA 138 138 138 138  K 4.3 4.1 4.4 4.7  CL 95* 100 99 102  CO2 27 22 25  20*  GLUCOSE 250* 264* 306* 238*  BUN 36* 38* 43* 38*  CREATININE 1.37* 1.43* 1.34* 1.16*  CALCIUM 9.2 8.6* 9.2 9.2  MG  --  2.1 2.1 1.9   Liver Function Tests: Recent Labs  Lab 09/18/19 1644 09/19/19 0043 09/20/19 0146 09/21/19 0500  AST 35 28 26 25   ALT 17 17 19 16   ALKPHOS 63 53 64 64  BILITOT 0.5 0.6 0.3 0.6  PROT 7.7 6.7 7.3 6.9  ALBUMIN 3.2* 2.7* 2.9* 3.0*   No results for input(s): LIPASE, AMYLASE in the last 168 hours. No results for input(s): AMMONIA in the last 168 hours. Cardiac Enzymes: No results for input(s): CKTOTAL, CKMB, CKMBINDEX, TROPONINI in the last 168 hours. BNP (last 3 results) No results for input(s): BNP in the last 8760 hours.  ProBNP (last 3 results) No results for input(s): PROBNP in the last 8760 hours.  CBG: Recent Labs  Lab 09/20/19 2041 09/20/19 2357 09/21/19 0403 09/21/19 0749 09/21/19 1128  GLUCAP 156* 154* 215* 197* 198*   Recent Results (from the past 240 hour(s))  Novel Coronavirus, NAA (Hosp order, Send-out to Ref Lab; TAT 18-24 hrs     Status: Abnormal   Collection Time: 09/14/19  1:17 PM   Specimen: Nasopharyngeal Swab; Respiratory  Result Value Ref Range Status   SARS-CoV-2, NAA DETECTED (A) NOT DETECTED Final    Comment: (NOTE)                  Client Requested Flag This nucleic acid amplification test was developed  and its performance characteristics determined by [employer]. Nucleic acid amplification tests include PCR and TMA. This test has not been FDA cleared or approved. This test has been authorized by FDA under an Emergency Use Authorization (EUA). This test is only authorized for the duration of time the declaration that circumstances exist justifying the authorization of the emergency use of in vitro diagnostic tests for detection of SARS-CoV-2 virus and/or diagnosis of COVID-19 infection under section 564(b)(1) of the Act, 21 U.S.C. [id number]) (1), unless the authorization is terminated or revoked sooner. When diagnostic testing is negative, the possibility of a false negative result should be considered in the context of a patient's recent exposures and the presence of clinical signs and symptoms consistent with COVID-19. An individual without symptoms of COVID-  19 and who is not shedding SARS-CoV-2 virus would  expect to have a negative (not detected) result in this assay. Performed At: [facility] [address] [place], [place] [id number] Rush Farmer MD [id number]    Gray  Final    Comment: Performed at [facility], [place] [address], [place], [place] [address]  Blood Culture (routine x 2)     Status: None (Preliminary result)   Collection Time: 09/18/19  5:00 PM   Specimen: BLOOD  Result Value Ref Range Status   Specimen Description BLOOD RIGHT ANTECUBITAL  Final   Special Requests   Final    BOTTLES DRAWN AEROBIC AND ANAEROBIC Blood Culture results may not be optimal due to an inadequate volume of blood received in culture bottles   Culture   Final    NO GROWTH 2 DAYS Performed at [facility], [place] [address] [place], [place] [address]    Report Status PENDING  Incomplete  Blood Culture (routine x 2)     Status: None (Preliminary result)   Collection Time: 09/18/19  6:40 PM   Specimen: BLOOD  Result Value Ref  Range Status   Specimen Description BLOOD LEFT ANTECUBITAL  Final   Special Requests   Final    BOTTLES DRAWN AEROBIC AND ANAEROBIC Blood Culture adequate volume   Culture   Final    NO GROWTH 2 DAYS Performed at [facility], [place] [address] [place],  [address]    Report Status PENDING  Incomplete     Studies: ECHOCARDIOGRAM COMPLETE  Result Date: 09/19/2019   ECHOCARDIOGRAM REPORT   Patient Name:   Raeya [facility] [place] Date of Exam: 09/19/2019 Medical Rec #:  [id number]              Height:       63.0 in Accession #:    [id number]             Weight:       160.0 lb Date of Birth:  January 16, 1946               BSA:          1.76 m Patient Age:    1 years               BP:           111/74 mmHg Patient Gender: F                      HR:           70 bpm. Exam Location:  Inpatient Procedure: 2D Echo                             MODIFIED REPORT: This report was modified by Eleonore Chiquito MD on 09/19/2019 due to error.  Indications:     Abnormal ECG R94.31  History:         Patient has no prior history of Echocardiogram examinations.                  Covid-19 Positive; Risk Factors:Hypertension and Diabetes.  Sonographer:     Mikki Santee RDCS (AE) Referring Phys:  [place] Diagnosing Phys: Eleonore Chiquito MD IMPRESSIONS  1. Left ventricular ejection fraction, by visual estimation, is 55 to 60%. The left ventricle has normal function. There is no left ventricular hypertrophy.  2. Left ventricular diastolic parameters are consistent with Grade I diastolic dysfunction (impaired relaxation).  3. The left ventricle has no regional wall motion abnormalities.  4. Global right ventricle has normal systolic function.The right ventricular size is normal. No increase in right ventricular wall thickness.  5. Left atrial size was normal.  6. Right atrial size was normal.  7. Presence of pericardial fat pad.  8. Trivial pericardial effusion is present.  9. Mild mitral annular calcification.  10. The mitral valve is degenerative. Trivial mitral valve regurgitation. 11. The tricuspid valve is grossly normal. Tricuspid valve regurgitation is trivial. 12. The aortic valve is tricuspid. Aortic valve regurgitation is not visualized. No evidence of aortic valve sclerosis or stenosis. 13. The pulmonic valve was normal in structure. Pulmonic valve regurgitation is not visualized. 14. Normal pulmonary artery systolic pressure. 15. The inferior vena cava is normal in size with greater than 50% respiratory variability, suggesting right atrial pressure of 3 mmHg. 16. No prior Echocardiogram. FINDINGS  Left Ventricle: Left ventricular ejection fraction, by visual estimation, is 55 to 60%. The left ventricle has normal function. The left ventricle has no regional wall motion abnormalities. The left ventricular internal cavity size was the left ventricle is normal in size. There is no left ventricular hypertrophy. Left ventricular diastolic parameters are consistent with Grade I diastolic dysfunction (impaired relaxation). Normal left atrial pressure. Right Ventricle: The right ventricular size is normal. No increase in right ventricular wall thickness. Global RV systolic function is has normal systolic function. The tricuspid regurgitant velocity is 1.96 m/s, and with an assumed right atrial pressure  of 3 mmHg, the estimated right ventricular systolic pressure is normal at 18.4 mmHg. Left Atrium: Left atrial size was normal in size. Right Atrium: Right atrial size was normal in size Pericardium: Trivial pericardial effusion is present. Presence of pericardial fat pad. Mitral Valve: The mitral valve is degenerative in appearance. Mild mitral annular calcification. Trivial mitral valve regurgitation. Tricuspid Valve: The tricuspid valve is grossly normal. Tricuspid valve regurgitation is trivial. Aortic Valve: The aortic valve is tricuspid. Aortic valve regurgitation is not visualized. The aortic valve is structurally  normal, with no evidence of sclerosis or stenosis. Pulmonic Valve: The pulmonic valve was normal in structure. Pulmonic valve regurgitation is not visualized. Pulmonic regurgitation is not visualized. Aorta: The aortic root is normal in size and structure. Venous: The inferior vena cava is normal in size with greater than 50% respiratory variability, suggesting right atrial pressure of 3 mmHg. IAS/Shunts: No atrial level shunt detected by color flow Doppler.  LEFT VENTRICLE PLAX 2D LVIDd:         4.09 cm  Diastology LVIDs:         2.57 cm  LV e' lateral:   5.66 cm/s LV PW:         0.94 cm  LV E/e' lateral: 13.2 LV IVS:        0.78 cm  LV e' medial:    6.20 cm/s LVOT diam:     2.20 cm  LV E/e' medial:  12.0 LV SV:         50 ml LV SV Index:   27.45 LVOT Area:     3.80 cm  RIGHT VENTRICLE RV S prime:     11.50 cm/s TAPSE (M-mode): 2.0 cm LEFT ATRIUM             Index       RIGHT ATRIUM          Index LA diam:        3.70 cm 2.10 cm/m  RA  Area:     9.51 cm LA Vol (A2C):   38.3 ml 21.78 ml/m RA Volume:   16.10 ml 9.15 ml/m LA Vol (A4C):   43.2 ml 24.56 ml/m LA Biplane Vol: 40.9 ml 23.26 ml/m   AORTA Ao Root diam: 2.90 cm MITRAL VALVE                        TRICUSPID VALVE MV Area (PHT): 2.73 cm             TR Peak grad:   15.4 mmHg MV PHT:        80.62 msec           TR Vmax:        196.00 cm/s MV Decel Time: 278 msec MV E velocity: 74.60 cm/s 103 cm/s  SHUNTS MV A velocity: 98.10 cm/s 70.3 cm/s Systemic Diam: 2.20 cm MV E/A ratio:  0.76       1.5  Eleonore Chiquito MD Electronically signed by Eleonore Chiquito MD Signature Date/Time: 09/19/2019/4:03:51 PM    Final (Updated)     Scheduled Meds: . aspirin  81 mg Oral Daily  . dexamethasone (DECADRON) injection  6 mg Intravenous Q24H  . insulin aspart  0-9 Units Subcutaneous Q4H  . insulin glargine  12 Units Subcutaneous Daily  . losartan  50 mg Oral Daily  . pantoprazole  40 mg Oral Daily  . simvastatin  40 mg Oral q1800  . sodium chloride flush  3 mL  Intravenous Q12H  . sodium chloride flush  3 mL Intravenous Q12H    Continuous Infusions: . sodium chloride    . remdesivir 100 mg in NS 100 mL 100 mg (09/21/19 1125)     Flora Lipps, MD  [facility] 09/21/2019

## 2019-09-21 NOTE — Progress Notes (Signed)
Subjective:  Breathing better no chest pain   Objective:  Vitals:   09/20/19 1400 09/20/19 2012 09/21/19 0000 09/21/19 0309  BP: (!) 145/87 (!) 142/90 138/72 129/66  Pulse:  69 63 67  Resp: 20 20 18    Temp: 98.2 F (36.8 C) 98.5 F (36.9 C) 98.3 F (36.8 C) 97.7 F (36.5 C)  TempSrc: Oral Oral Oral Oral  SpO2: 99% 98% 99%   Weight:      Height:        Intake/Output from previous day:  Intake/Output Summary (Last 24 hours) at 09/21/2019 0838 Last data filed at 09/21/2019 0501 Gross per 24 hour  Intake 711 ml  Output 550 ml  Net 161 ml    Physical Exam:  Affect appropriate Overweight black female  HEENT: normal Neck supple with no adenopathy JVP normal no bruits no thyromegaly Lungs rhonchi bibasilar  good diaphragmatic motion Heart:  S1/S2 no murmur, no rub, gallop or click PMI normal Abdomen: benighn, BS positve, no tenderness, no AAA no bruit.  No HSM or HJR Distal pulses intact with no bruits No edema Neuro non-focal Skin warm and dry No muscular weakness   Lab Results: Basic Metabolic Panel: Recent Labs    09/20/19 0146 09/21/19 0500  NA 138 138  K 4.4 4.7  CL 99 102  CO2 25 20*  GLUCOSE 306* 238*  BUN 43* 38*  CREATININE 1.34* 1.16*  CALCIUM 9.2 9.2  MG 2.1 1.9   Liver Function Tests: Recent Labs    09/20/19 0146 09/21/19 0500  AST 26 25  ALT 19 16  ALKPHOS 64 64  BILITOT 0.3 0.6  PROT 7.3 6.9  ALBUMIN 2.9* 3.0*   No results for input(s): LIPASE, AMYLASE in the last 72 hours. CBC: Recent Labs    09/20/19 0146 09/21/19 0500  WBC 7.9 7.9  NEUTROABS 6.5 7.1  HGB 11.4* 12.1  HCT 34.6* 38.2  MCV 89.4 89.7  PLT 441* 376   Cardiac Enzymes: No results for input(s): CKTOTAL, CKMB, CKMBINDEX, TROPONINI in the last 72 hours. BNP: Invalid input(s): POCBNP D-Dimer: Recent Labs    09/19/19 0506 09/20/19 0146  DDIMER 1.47* 1.15*   Hemoglobin A1C: Recent Labs    09/18/19 2100  HGBA1C 8.2*   Fasting Lipid  Panel: Recent Labs    09/20/19 0146  CHOL 138  HDL 54  LDLCALC 66  TRIG 88  CHOLHDL 2.6   Thyroid Function Tests: No results for input(s): TSH, T4TOTAL, T3FREE, THYROIDAB in the last 72 hours.  Invalid input(s): FREET3 Anemia Panel: Recent Labs    09/21/19 0500  FERRITIN 852*    Imaging: ECHOCARDIOGRAM COMPLETE  Result Date: 09/19/2019   ECHOCARDIOGRAM REPORT   Patient Name:   Jacqueline Barnett [place] Date of Exam: 09/19/2019 Medical Rec #:  [id number]              Height:       63.0 in Accession #:    [id number]             Weight:       160.0 lb Date of Birth:  1946-07-15               BSA:          1.76 m Patient Age:    73 years               BP:           111/74 mmHg Patient Gender: F  HR:           70 bpm. Exam Location:  Inpatient Procedure: 2D Echo                             MODIFIED REPORT: This report was modified by Eleonore Chiquito MD on 09/19/2019 due to error.  Indications:     Abnormal ECG R94.31  History:         Patient has no prior history of Echocardiogram examinations.                  Covid-19 Positive; Risk Factors:Hypertension and Diabetes.  Sonographer:     Mikki Santee RDCS (AE) Referring Phys:  [place] Diagnosing Phys: Eleonore Chiquito MD IMPRESSIONS  1. Left ventricular ejection fraction, by visual estimation, is 55 to 60%. The left ventricle has normal function. There is no left ventricular hypertrophy.  2. Left ventricular diastolic parameters are consistent with Grade I diastolic dysfunction (impaired relaxation).  3. The left ventricle has no regional wall motion abnormalities.  4. Global right ventricle has normal systolic function.The right ventricular size is normal. No increase in right ventricular wall thickness.  5. Left atrial size was normal.  6. Right atrial size was normal.  7. Presence of pericardial fat pad.  8. Trivial pericardial effusion is present.  9. Mild mitral annular calcification. 10. The mitral valve is  degenerative. Trivial mitral valve regurgitation. 11. The tricuspid valve is grossly normal. Tricuspid valve regurgitation is trivial. 12. The aortic valve is tricuspid. Aortic valve regurgitation is not visualized. No evidence of aortic valve sclerosis or stenosis. 13. The pulmonic valve was normal in structure. Pulmonic valve regurgitation is not visualized. 14. Normal pulmonary artery systolic pressure. 15. The inferior vena cava is normal in size with greater than 50% respiratory variability, suggesting right atrial pressure of 3 mmHg. 16. No prior Echocardiogram. FINDINGS  Left Ventricle: Left ventricular ejection fraction, by visual estimation, is 55 to 60%. The left ventricle has normal function. The left ventricle has no regional wall motion abnormalities. The left ventricular internal cavity size was the left ventricle is normal in size. There is no left ventricular hypertrophy. Left ventricular diastolic parameters are consistent with Grade I diastolic dysfunction (impaired relaxation). Normal left atrial pressure. Right Ventricle: The right ventricular size is normal. No increase in right ventricular wall thickness. Global RV systolic function is has normal systolic function. The tricuspid regurgitant velocity is 1.96 m/s, and with an assumed right atrial pressure  of 3 mmHg, the estimated right ventricular systolic pressure is normal at 18.4 mmHg. Left Atrium: Left atrial size was normal in size. Right Atrium: Right atrial size was normal in size Pericardium: Trivial pericardial effusion is present. Presence of pericardial fat pad. Mitral Valve: The mitral valve is degenerative in appearance. Mild mitral annular calcification. Trivial mitral valve regurgitation. Tricuspid Valve: The tricuspid valve is grossly normal. Tricuspid valve regurgitation is trivial. Aortic Valve: The aortic valve is tricuspid. Aortic valve regurgitation is not visualized. The aortic valve is structurally normal, with no evidence  of sclerosis or stenosis. Pulmonic Valve: The pulmonic valve was normal in structure. Pulmonic valve regurgitation is not visualized. Pulmonic regurgitation is not visualized. Aorta: The aortic root is normal in size and structure. Venous: The inferior vena cava is normal in size with greater than 50% respiratory variability, suggesting right atrial pressure of 3 mmHg. IAS/Shunts: No atrial level shunt detected by color flow Doppler.  LEFT  VENTRICLE PLAX 2D LVIDd:         4.09 cm  Diastology LVIDs:         2.57 cm  LV e' lateral:   5.66 cm/s LV PW:         0.94 cm  LV E/e' lateral: 13.2 LV IVS:        0.78 cm  LV e' medial:    6.20 cm/s LVOT diam:     2.20 cm  LV E/e' medial:  12.0 LV SV:         50 ml LV SV Index:   27.45 LVOT Area:     3.80 cm  RIGHT VENTRICLE RV S prime:     11.50 cm/s TAPSE (M-mode): 2.0 cm LEFT ATRIUM             Index       RIGHT ATRIUM          Index LA diam:        3.70 cm 2.10 cm/m  RA Area:     9.51 cm LA Vol (A2C):   38.3 ml 21.78 ml/m RA Volume:   16.10 ml 9.15 ml/m LA Vol (A4C):   43.2 ml 24.56 ml/m LA Biplane Vol: 40.9 ml 23.26 ml/m   AORTA Ao Root diam: 2.90 cm MITRAL VALVE                        TRICUSPID VALVE MV Area (PHT): 2.73 cm             TR Peak grad:   15.4 mmHg MV PHT:        80.62 msec           TR Vmax:        196.00 cm/s MV Decel Time: 278 msec MV E velocity: 74.60 cm/s 103 cm/s  SHUNTS MV A velocity: 98.10 cm/s 70.3 cm/s Systemic Diam: 2.20 cm MV E/A ratio:  0.76       1.5  Eleonore Chiquito MD Electronically signed by Eleonore Chiquito MD Signature Date/Time: 09/19/2019/4:03:51 PM    Final (Updated)     Cardiac Studies:  ECG: SR biphasic T waves with inversion anterolateral leads    Telemetry:  NSR   Echo: EF 55-60% no HOCM trivial effusion trivial MR   Medications:   . aspirin  81 mg Oral Daily  . dexamethasone (DECADRON) injection  6 mg Intravenous Q24H  . insulin aspart  0-9 Units Subcutaneous Q4H  . insulin glargine  12 Units Subcutaneous Daily  .  losartan  50 mg Oral Daily  . pantoprazole  40 mg Oral Daily  . simvastatin  40 mg Oral q1800  . sodium chloride flush  3 mL Intravenous Q12H  . sodium chloride flush  3 mL Intravenous Q12H     . sodium chloride    . remdesivir 100 mg in NS 100 mL Stopped (09/20/19 0930)    Assessment/Plan:   1. Abnormal ECG:  Does not appear to represent Takatsubo's DCM, ischemic event with low and no delta troponin, and not HOCM. Will arrange outpatient cardiac MRI in 2-3 weeks and cardiology f/u to assess for myocarditis Since she is improving with no chest pain or signs of CHF and no marked elevation in troponin there is no reason to expose MRI department to COVID positive patient  Continue ARB for BP and statin for  HLD. Avoid QT prolonging drugs Continue Rx for COVID including dexamethasone  Cardiology sign off  Jenkins Rouge 09/21/2019, 8:38 AM

## 2019-09-21 NOTE — Telephone Encounter (Signed)
Placed order for cardiac MRI and made patient an appointment with Truitt Merle NP on 11/05/19.

## 2019-09-21 NOTE — Telephone Encounter (Signed)
-----   Message from Josue Hector, MD sent at 09/21/2019  8:43 AM EST ----- Needs cardiac MRI in 3 weeks myocarditis COVID positive now F/u with me or PA in 4-6 weeks

## 2019-09-22 ENCOUNTER — Other Ambulatory Visit: Payer: Self-pay | Admitting: Physician Assistant

## 2019-09-22 DIAGNOSIS — Z7189 Other specified counseling: Secondary | ICD-10-CM

## 2019-09-22 LAB — GLUCOSE, CAPILLARY
Glucose-Capillary: 125 mg/dL — ABNORMAL HIGH (ref 70–99)
Glucose-Capillary: 136 mg/dL — ABNORMAL HIGH (ref 70–99)
Glucose-Capillary: 165 mg/dL — ABNORMAL HIGH (ref 70–99)
Glucose-Capillary: 191 mg/dL — ABNORMAL HIGH (ref 70–99)
Glucose-Capillary: 231 mg/dL — ABNORMAL HIGH (ref 70–99)
Glucose-Capillary: 95 mg/dL (ref 70–99)

## 2019-09-22 LAB — CBC WITH DIFFERENTIAL/PLATELET
Abs Immature Granulocytes: 0.34 10*3/uL — ABNORMAL HIGH (ref 0.00–0.07)
Basophils Absolute: 0 10*3/uL (ref 0.0–0.1)
Basophils Relative: 0 %
Eosinophils Absolute: 0 10*3/uL (ref 0.0–0.5)
Eosinophils Relative: 0 %
HCT: 37.5 % (ref 36.0–46.0)
Hemoglobin: 12.3 g/dL (ref 12.0–15.0)
Immature Granulocytes: 4 %
Lymphocytes Relative: 15 %
Lymphs Abs: 1.2 10*3/uL (ref 0.7–4.0)
MCH: 29 pg (ref 26.0–34.0)
MCHC: 32.8 g/dL (ref 30.0–36.0)
MCV: 88.4 fL (ref 80.0–100.0)
Monocytes Absolute: 0.4 10*3/uL (ref 0.1–1.0)
Monocytes Relative: 5 %
Neutro Abs: 5.9 10*3/uL (ref 1.7–7.7)
Neutrophils Relative %: 76 %
Platelets: 498 10*3/uL — ABNORMAL HIGH (ref 150–400)
RBC: 4.24 MIL/uL (ref 3.87–5.11)
RDW: 12.7 % (ref 11.5–15.5)
WBC: 7.8 10*3/uL (ref 4.0–10.5)
nRBC: 0 % (ref 0.0–0.2)

## 2019-09-22 LAB — COMPREHENSIVE METABOLIC PANEL
ALT: 15 U/L (ref 0–44)
AST: 20 U/L (ref 15–41)
Albumin: 2.9 g/dL — ABNORMAL LOW (ref 3.5–5.0)
Alkaline Phosphatase: 62 U/L (ref 38–126)
Anion gap: 12 (ref 5–15)
BUN: 35 mg/dL — ABNORMAL HIGH (ref 8–23)
CO2: 25 mmol/L (ref 22–32)
Calcium: 9.4 mg/dL (ref 8.9–10.3)
Chloride: 102 mmol/L (ref 98–111)
Creatinine, Ser: 1.08 mg/dL — ABNORMAL HIGH (ref 0.44–1.00)
GFR calc Af Amer: 59 mL/min — ABNORMAL LOW (ref >60)
GFR calc non Af Amer: 51 mL/min — ABNORMAL LOW (ref >60)
Glucose, Bld: 237 mg/dL — ABNORMAL HIGH (ref 70–99)
Potassium: 4.6 mmol/L (ref 3.5–5.1)
Sodium: 139 mmol/L (ref 135–145)
Total Bilirubin: 0.5 mg/dL (ref 0.3–1.2)
Total Protein: 6.8 g/dL (ref 6.5–8.1)

## 2019-09-22 LAB — MAGNESIUM: Magnesium: 1.5 mg/dL — ABNORMAL LOW (ref 1.7–2.4)

## 2019-09-22 LAB — D-DIMER, QUANTITATIVE: D-Dimer, Quant: 0.91 ug/mL-FEU — ABNORMAL HIGH (ref 0.00–0.50)

## 2019-09-22 LAB — C-REACTIVE PROTEIN: CRP: 5.7 mg/dL — ABNORMAL HIGH (ref <1.0)

## 2019-09-22 LAB — FERRITIN: Ferritin: 544 ng/mL — ABNORMAL HIGH (ref 11–307)

## 2019-09-22 MED ORDER — MAGNESIUM OXIDE 400 (241.3 MG) MG PO TABS
400.0000 mg | ORAL_TABLET | Freq: Two times a day (BID) | ORAL | Status: DC
  Administered 2019-09-22 – 2019-09-24 (×5): 400 mg via ORAL
  Filled 2019-09-22 (×5): qty 1

## 2019-09-22 NOTE — Evaluation (Signed)
Physical Therapy Evaluation Patient Details Name: Jacqueline Barnett MRN: [id number] DOB: 15-Sep-1946 Today's Date: 09/22/2019   History of Present Illness  Pt adm with acute hypoxic respiratory failure due to covid PNA. PMH - HTN, DM, CKD,   Clinical Impression  Pt admitted with above diagnosis and presents to PT with functional limitations due to deficits listed below (See PT problem list). Pt needs skilled PT to maximize independence and safety to allow discharge to home with family. Recommend incentive spirometer (none on unit and nursing ordered).     Follow Up Recommendations No PT follow up    Equipment Recommendations  Other (comment)(possibly O2)    Recommendations for Other Services       Precautions / Restrictions Precautions Precautions: Other (comment) Precaution Comments: watch SpO2      Mobility  Bed Mobility Overal bed mobility: Modified Independent             General bed mobility comments: Incr time  Transfers Overall transfer level: Needs assistance Equipment used: None Transfers: Sit to/from Stand Sit to Stand: Min guard         General transfer comment: Assist for safety and lines  Ambulation/Gait Ambulation/Gait assistance: Min guard Gait Distance (Feet): 150 Feet Assistive device: None Gait Pattern/deviations: Step-through pattern;Decreased stride length Gait velocity: decr Gait velocity interpretation: <1.31 ft/sec, indicative of household ambulator General Gait Details: Assist for safety and lines. Pt slightly unsteady but no overt loss of balance  Stairs            Wheelchair Mobility    Modified Rankin (Stroke Patients Only)       Balance Overall balance assessment: Needs assistance Sitting-balance support: No upper extremity supported;Feet supported Sitting balance-Leahy Scale: Good     Standing balance support: No upper extremity supported;During functional activity Standing balance-Leahy Scale: Fair                               Pertinent Vitals/Pain Pain Assessment: No/denies pain    Home Living Family/patient expects to be discharged to:: Private residence Living Arrangements: Children Available Help at Discharge: Family Type of Home: House Home Access: Stairs to enter   Technical brewer of Steps: 1 Home Layout: One level Home Equipment: None      Prior Function Level of Independence: Independent               Hand Dominance        Extremity/Trunk Assessment   Upper Extremity Assessment Upper Extremity Assessment: Overall WFL for tasks assessed    Lower Extremity Assessment Lower Extremity Assessment: Generalized weakness       Communication   Communication: No difficulties  Cognition Arousal/Alertness: Awake/alert Behavior During Therapy: WFL for tasks assessed/performed Overall Cognitive Status: Within Functional Limits for tasks assessed                                        General Comments General comments (skin integrity, edema, etc.): amb on 4L of O2 with pulse ox not picking up reading. After amb SpO2 84%. Incr to 89 % with pursed lip breathing    Exercises     Assessment/Plan    PT Assessment Patient needs continued PT services  PT Problem List         PT Treatment Interventions DME instruction;Gait training;Functional mobility training;Therapeutic activities;Therapeutic exercise;Balance training;Patient/family education  PT Goals (Current goals can be found in the Care Plan section)  Acute Rehab PT Goals Patient Stated Goal: return home PT Goal Formulation: With patient Time For Goal Achievement: 10/06/19 Potential to Achieve Goals: Good    Frequency Min 3X/week   Barriers to discharge Inaccessible home environment Has to walk up hill to house    Co-evaluation               AM-PAC PT "6 Clicks" Mobility  Outcome Measure Help needed turning from your back to your side while in a flat  bed without using bedrails?: None Help needed moving from lying on your back to sitting on the side of a flat bed without using bedrails?: None Help needed moving to and from a bed to a chair (including a wheelchair)?: A Little Help needed standing up from a chair using your arms (e.g., wheelchair or bedside chair)?: A Little Help needed to walk in hospital room?: A Little Help needed climbing 3-5 steps with a railing? : A Little 6 Click Score: 20    End of Session Equipment Utilized During Treatment: Oxygen Activity Tolerance: Patient limited by fatigue Patient left: in bed;with call bell/phone within reach   PT Visit Diagnosis: Unsteadiness on feet (R26.81);Other abnormalities of gait and mobility (R26.89);Muscle weakness (generalized) (M62.81)    Time: 1450-1506 PT Time Calculation (min) (ACUTE ONLY): 16 min   Charges:   PT Evaluation $PT Eval Moderate Complexity: [place] Pager [phone] Office [place] 09/22/2019, 3:15 PM

## 2019-09-22 NOTE — Progress Notes (Signed)
PROGRESS NOTE  Jacqueline Barnett [id number] DOB: 03-30-1946 DOA: 09/18/2019 PCP: Wenda Low, MD   LOS: 3 days   Brief narrative: Jacqueline Barnett is an 73 y.o. female with medical history significant for hypertension, type 2 diabetes mellitus, probable CKD stage III, was referred from the urgent care clinic to the emergency room because of positive coronavirus test.  She complained of generalized weakness.  In the ED, her oxygen saturation was 88% on room air.  X-ray showed multifocal pneumonia due to coronavirus infection.  Assessment/Plan:  Principal Problem:   Pneumonia due to COVID-19 virus Active Problems:   DM2 (diabetes mellitus, type 2) (HCC)   Abnormal ECG   HTN (hypertension)   Elevated troponin   Prolonged QT interval   Acute hypoxemic respiratory failure ([place])  COVID-19 pneumonia: Continue IV Remdesivir and IV dexamethasone.  Monitor inflammatory markers daily.  Continue supportive care.  Trending down ferritin, D-dimer and CRP.  We will try to wean oxygen as possible.  Recent Labs    09/20/19 0146 09/21/19 0500 09/22/19 0521  DDIMER 1.15* 0.82* 0.91*  FERRITIN 865* 852* 544*  CRP 19.3* 8.7* 5.7*    Lab Results  Component Value Date   SARSCOV2NAA DETECTED (A) 09/14/2019    Acute hypoxemic respiratory failure secondary to COVID-19 pneumonia: She is on 4 L/min oxygen via nasal cannula.   Continue to wean if possible  Abnormal EKG (Diffuse T wave inversion, QTC prolongation)/mildly elevated troponin: Seen by cardiology  2D echo showed EF 55 to 123456, grade 1 diastolic dysfunction, no LVH.  Patient received IV heparin.  Currently on Lovenox. Continue aspirin and simvastatin.    Cardiology recommends outpatient follow-up with MRI in 2 to 3 weeks and follow-up with cardiology.  Appointment for cardiac MRI is on 11/05/2019.  Avoid QT prolonging drugs  Type 2 diabetes mellitus with hyperglycemia: Hemoglobin A1c is 8.2.    Continue sliding scale insulin,  Lantus.  Continue to hold Home Farxiga and Metformin .  Monitor blood glucose levels closely on dexamethasone.  Last POC glucose was 165.  Essential hypertension: Continue antihypertensives, closely monitor blood pressure  Mild hypomagnesemia.  Will replace orally.  Check magnesium levels in a.m.  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  VTE Prophylaxis:  Lovenox  Code Status: Full code  Family Communication: None today.Spoke with patient's daughter Tawanna Sat on the phone yesterday.  Disposition Plan: Likely home with home health.  PT evaluation pending.  We will continue to wean oxygen as able.   Consultants:  Cardiology  Procedures:  None  Antibiotics: Anti-infectives (From admission, onward)   Start     Dose/Rate Route Frequency Ordered Stop   09/19/19 1000  remdesivir 100 mg in sodium chloride 0.9 % 100 mL IVPB     100 mg 200 mL/hr over 30 Minutes Intravenous Daily 09/18/19 2021 09/23/19 0959   09/18/19 2030  remdesivir 200 mg in sodium chloride 0.9% 250 mL IVPB     200 mg 580 mL/hr over 30 Minutes Intravenous Once 09/18/19 2021 09/18/19 2156      Subjective: Today, patient denies increasing shortness of breath cough or fever.  No nausea vomiting or abdominal pain.  Objective: Vitals:   09/22/19 0400 09/22/19 0558  BP: 121/89   Pulse: 81   Resp:    Temp: 98.3 F (36.8 C) 97.9 F (36.6 C)  SpO2: 99%     Intake/Output Summary (Last 24 hours) at 09/22/2019 0740 Last data filed at 09/22/2019 0450 Gross per 24 hour  Intake 750 ml  Output 825 ml  Net -75 ml   Filed Weights   09/18/19 1600 09/19/19 2330 09/20/19 0100  Weight: 72.6 kg 72.5 kg 72.5 kg   Body mass index is 28.32 kg/m.   Physical Exam: General: Alert awake oriented.  Not in obvious distress.  On 4 L of oxygen by nasal cannula HENT: Normocephalic, pupils equally reacting to light and accommodation.  No scleral pallor or icterus noted. Oral mucosa is moist.  Chest:  Diminished breath sounds bilaterally.  No obvious crackles or wheezes. CVS: S1 &S2 heard. No murmur.  Regular rate and rhythm. Abdomen: Soft, nontender, nondistended.  Bowel sounds are heard.  Liver is not palpable, no abdominal mass palpated Extremities: No cyanosis, clubbing or edema.  Peripheral pulses are palpable. Psych: Alert, awake and oriented, normal mood CNS:  No cranial nerve deficits.  Power equal in all extremities.  No sensory deficits noted.  No cerebellar signs.   Skin: Warm and dry.  No rashes noted.  Data Review: I have personally reviewed the following laboratory data and studies,  CBC: Recent Labs  Lab 09/18/19 1644 09/19/19 0043 09/20/19 0146 09/21/19 0500 09/22/19 0521  WBC 6.5 6.8 7.9 7.9 7.8  NEUTROABS 5.7 6.1 6.5 7.1 5.9  HGB 12.3 10.8* 11.4* 12.1 12.3  HCT 37.4 33.4* 34.6* 38.2 37.5  MCV 88.8 90.3 89.4 89.7 88.4  PLT 340 321 441* 376 [id number]*   Basic Metabolic Panel: Recent Labs  Lab 09/18/19 1644 09/19/19 0043 09/20/19 0146 09/21/19 0500 09/22/19 0521  NA 138 138 138 138 139  K 4.3 4.1 4.4 4.7 4.6  CL 95* 100 99 102 102  CO2 27 22 25  20* 25  GLUCOSE 250* 264* 306* 238* 237*  BUN 36* 38* 43* 38* 35*  CREATININE 1.37* 1.43* 1.34* 1.16* 1.08*  CALCIUM 9.2 8.6* 9.2 9.2 9.4  MG  --  2.1 2.1 1.9 1.5*   Liver Function Tests: Recent Labs  Lab 09/18/19 1644 09/19/19 0043 09/20/19 0146 09/21/19 0500 09/22/19 0521  AST 35 28 26 25 20   ALT 17 17 19 16 15   ALKPHOS 63 53 64 64 62  BILITOT 0.5 0.6 0.3 0.6 0.5  PROT 7.7 6.7 7.3 6.9 6.8  ALBUMIN 3.2* 2.7* 2.9* 3.0* 2.9*   No results for input(s): LIPASE, AMYLASE in the last 168 hours. No results for input(s): AMMONIA in the last 168 hours. Cardiac Enzymes: No results for input(s): CKTOTAL, CKMB, CKMBINDEX, TROPONINI in the last 168 hours. BNP (last 3 results) No results for input(s): BNP in the last 8760 hours.  ProBNP (last 3 results) No results for input(s): PROBNP in the last 8760  hours.  CBG: Recent Labs  Lab 09/21/19 1128 09/21/19 1559 09/21/19 2021 09/22/19 0008 09/22/19 0400  GLUCAP 198* 145* 108* 191* 231*   Recent Results (from the past 240 hour(s))  Novel Coronavirus, NAA (Hosp order, Send-out to Ref Lab; TAT 18-24 hrs     Status: Abnormal   Collection Time: 09/14/19  1:17 PM   Specimen: Nasopharyngeal Swab; Respiratory  Result Value Ref Range Status   SARS-CoV-2, NAA DETECTED (A) NOT DETECTED Final    Comment: (NOTE)                  Client Requested Flag This nucleic acid amplification test was developed and its performance characteristics determined by [employer]. Nucleic acid amplification tests include PCR and TMA. This test has not been FDA cleared or approved. This test has been authorized by FDA  under an Emergency Use Authorization (EUA). This test is only authorized for the duration of time the declaration that circumstances exist justifying the authorization of the emergency use of in vitro diagnostic tests for detection of SARS-CoV-2 virus and/or diagnosis of COVID-19 infection under section 564(b)(1) of the Act, 21 U.S.C. [id number]) (1), unless the authorization is terminated or revoked sooner. When diagnostic testing is negative, the possibility of a false negative result should be considered in the context of a patient's recent exposures and the presence of clinical signs and symptoms consistent with COVID-19. An individual without symptoms of COVID-  19 and who is not shedding SARS-CoV-2 virus would expect to have a negative (not detected) result in this assay. Performed At: [facility] [address] [place], [place] [id number] Rush Farmer MD [id number]    [place]  Final    Comment: Performed at [facility], [place] [address] [place], [place] [address]  Blood Culture (routine x 2)     Status: None (Preliminary result)   Collection Time: 09/18/19  5:00 PM   Specimen:  BLOOD  Result Value Ref Range Status   Specimen Description BLOOD RIGHT ANTECUBITAL  Final   Special Requests   Final    BOTTLES DRAWN AEROBIC AND ANAEROBIC Blood Culture results may not be optimal due to an inadequate volume of blood received in culture bottles   Culture   Final    NO GROWTH 3 DAYS Performed at [facility], [place] [address] [place], [place] [address]    Report Status PENDING  Incomplete  Blood Culture (routine x 2)     Status: None (Preliminary result)   Collection Time: 09/18/19  6:40 PM   Specimen: BLOOD  Result Value Ref Range Status   Specimen Description BLOOD LEFT ANTECUBITAL  Final   Special Requests   Final    BOTTLES DRAWN AEROBIC AND ANAEROBIC Blood Culture adequate volume   Culture   Final    NO GROWTH 3 DAYS Performed at [facility], [place] [address] [place],  [address]    Report Status PENDING  Incomplete     Studies: No results found.  Scheduled Meds: . aspirin  81 mg Oral Daily  . dexamethasone (DECADRON) injection  6 mg Intravenous Q24H  . enoxaparin (LOVENOX) injection  40 mg Subcutaneous Q24H  . insulin aspart  0-9 Units Subcutaneous Q4H  . insulin glargine  14 Units Subcutaneous Daily  . losartan  50 mg Oral Daily  . pantoprazole  40 mg Oral Daily  . simvastatin  40 mg Oral q1800  . sodium chloride flush  3 mL Intravenous Q12H  . sodium chloride flush  3 mL Intravenous Q12H    Continuous Infusions: . sodium chloride    . remdesivir 100 mg in NS 100 mL 100 mg (09/21/19 1125)     Flora Lipps, MD  [facility] 09/22/2019

## 2019-09-23 LAB — CBC WITH DIFFERENTIAL/PLATELET
Abs Immature Granulocytes: 0.41 10*3/uL — ABNORMAL HIGH (ref 0.00–0.07)
Basophils Absolute: 0 10*3/uL (ref 0.0–0.1)
Basophils Relative: 0 %
Eosinophils Absolute: 0 10*3/uL (ref 0.0–0.5)
Eosinophils Relative: 0 %
HCT: 36.9 % (ref 36.0–46.0)
Hemoglobin: 12.3 g/dL (ref 12.0–15.0)
Immature Granulocytes: 5 %
Lymphocytes Relative: 12 %
Lymphs Abs: 1 10*3/uL (ref 0.7–4.0)
MCH: 29.1 pg (ref 26.0–34.0)
MCHC: 33.3 g/dL (ref 30.0–36.0)
MCV: 87.2 fL (ref 80.0–100.0)
Monocytes Absolute: 0.2 10*3/uL (ref 0.1–1.0)
Monocytes Relative: 2 %
Neutro Abs: 6.6 10*3/uL (ref 1.7–7.7)
Neutrophils Relative %: 81 %
Platelets: 457 10*3/uL — ABNORMAL HIGH (ref 150–400)
RBC: 4.23 MIL/uL (ref 3.87–5.11)
RDW: 12.7 % (ref 11.5–15.5)
WBC: 8.2 10*3/uL (ref 4.0–10.5)
nRBC: 0 % (ref 0.0–0.2)

## 2019-09-23 LAB — COMPREHENSIVE METABOLIC PANEL
ALT: 14 U/L (ref 0–44)
AST: 18 U/L (ref 15–41)
Albumin: 2.7 g/dL — ABNORMAL LOW (ref 3.5–5.0)
Alkaline Phosphatase: 60 U/L (ref 38–126)
Anion gap: 12 (ref 5–15)
BUN: 32 mg/dL — ABNORMAL HIGH (ref 8–23)
CO2: 24 mmol/L (ref 22–32)
Calcium: 9.1 mg/dL (ref 8.9–10.3)
Chloride: 101 mmol/L (ref 98–111)
Creatinine, Ser: 1.07 mg/dL — ABNORMAL HIGH (ref 0.44–1.00)
GFR calc Af Amer: 59 mL/min — ABNORMAL LOW (ref >60)
GFR calc non Af Amer: 51 mL/min — ABNORMAL LOW (ref >60)
Glucose, Bld: 232 mg/dL — ABNORMAL HIGH (ref 70–99)
Potassium: 4.6 mmol/L (ref 3.5–5.1)
Sodium: 137 mmol/L (ref 135–145)
Total Bilirubin: 0.4 mg/dL (ref 0.3–1.2)
Total Protein: 6.4 g/dL — ABNORMAL LOW (ref 6.5–8.1)

## 2019-09-23 LAB — FERRITIN: Ferritin: 341 ng/mL — ABNORMAL HIGH (ref 11–307)

## 2019-09-23 LAB — GLUCOSE, CAPILLARY
Glucose-Capillary: 140 mg/dL — ABNORMAL HIGH (ref 70–99)
Glucose-Capillary: 153 mg/dL — ABNORMAL HIGH (ref 70–99)
Glucose-Capillary: 183 mg/dL — ABNORMAL HIGH (ref 70–99)
Glucose-Capillary: 209 mg/dL — ABNORMAL HIGH (ref 70–99)
Glucose-Capillary: 211 mg/dL — ABNORMAL HIGH (ref 70–99)
Glucose-Capillary: 93 mg/dL (ref 70–99)

## 2019-09-23 LAB — CULTURE, BLOOD (ROUTINE X 2)
Culture: NO GROWTH
Culture: NO GROWTH
Special Requests: ADEQUATE

## 2019-09-23 LAB — D-DIMER, QUANTITATIVE: D-Dimer, Quant: 2.22 ug/mL-FEU — ABNORMAL HIGH (ref 0.00–0.50)

## 2019-09-23 LAB — C-REACTIVE PROTEIN: CRP: 5.9 mg/dL — ABNORMAL HIGH (ref <1.0)

## 2019-09-23 LAB — MAGNESIUM: Magnesium: 1.6 mg/dL — ABNORMAL LOW (ref 1.7–2.4)

## 2019-09-23 MED ORDER — ORAL CARE MOUTH RINSE
15.0000 mL | Freq: Two times a day (BID) | OROMUCOSAL | Status: DC
  Administered 2019-09-23 – 2019-09-24 (×3): 15 mL via OROMUCOSAL

## 2019-09-23 NOTE — Plan of Care (Signed)

## 2019-09-23 NOTE — Progress Notes (Signed)
   Vital Signs MEWS/VS Documentation      09/22/2019 1901 09/22/2019 1902 09/22/2019 2043 09/22/2019 2047   MEWS Score:  1  1  0  0   MEWS Score Color:  Green  Green  Green  Green   Resp:  --  19  19  --   Pulse:  --  --  80  --   BP:  --  --  105/73  --   Temp:  --  --  98.9 F (37.2 C)  --   O2 Device:  --  --  Nasal Cannula  --   O2 Flow Rate (L/min):  --  --  4 L/min  --   Level of Consciousness:  --  --  --  Alert           Juanda Bond Joh Rao 09/23/2019,1:14 AM

## 2019-09-23 NOTE — Progress Notes (Signed)
PROGRESS NOTE  Jacqueline Barnett [id number] DOB: [birth year] DOA: 09/18/2019 PCP: Wenda Low, MD   LOS: 4 days   Brief narrative: Jacqueline Barnett is an 73 y.o. female with medical history significant for hypertension, type 2 diabetes mellitus, probable CKD stage III, was referred from the urgent care clinic to the emergency room because of positive coronavirus test.  She complained of generalized weakness.  In the ED, her oxygen saturation was 88% on room air.  X-ray showed multifocal pneumonia due to coronavirus infection.  Assessment/Plan:  Principal Problem:   Pneumonia due to COVID-19 virus Active Problems:   DM2 (diabetes mellitus, type 2) (HCC)   Abnormal ECG   HTN (hypertension)   Elevated troponin   Prolonged QT interval   Acute hypoxemic respiratory failure ([place])  COVID-19 pneumonia: Continue IV dexamethasone,  completed remdesivir 5 day course.   Monitor inflammatory markers daily.  Continue supportive care.  Trending down ferritin and CRP.  Increasing trend of D-dimer today.  On 4 L of oxygen by nasal cannula.  We will continue to wean oxygen today.  Recent Labs    09/21/19 0500 09/22/19 0521 09/23/19 0352  DDIMER 0.82* 0.91* 2.22*  FERRITIN 852* 544* 341*  CRP 8.7* 5.7* 5.9*    Lab Results  Component Value Date   SARSCOV2NAA DETECTED (A) 09/14/2019    Acute hypoxemic respiratory failure secondary to COVID-19 pneumonia:    Continue to wean oxygen.  On 4 L of oxygen by nasal cannula, will try to wean oxygen.  Spoke with the nursing staff about it.  Abnormal EKG (Diffuse T wave inversion, QTC prolongation)/mildly elevated troponin: Seen by cardiology  2D echo showed EF 55 to 123456, grade 1 diastolic dysfunction, no LVH.  Patient received IV heparin.  Currently on Lovenox. Continue aspirin and simvastatin.    Cardiology recommends outpatient follow-up with MRI in 2 to 3 weeks and follow-up with cardiology.  Appointment for cardiac MRI is on 11/05/2019.   Avoid QT prolonging drugs  Type 2 diabetes mellitus with hyperglycemia: Hemoglobin A1c is 8.2.    Continue sliding scale insulin, Lantus.  Continue to hold Home Farxiga and Metformin .  Monitor blood glucose levels closely on dexamethasone.  Last POC glucose was 211.  Essential hypertension: Continue antihypertensives, closely monitor blood pressure  Mild hypomagnesemia.  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  Creatinine of 1.0 today  Lab Results  Component Value Date   CREATININE 1.07 (H) 09/23/2019   CREATININE 1.08 (H) 09/22/2019   CREATININE 1.16 (H) 09/21/2019   VTE Prophylaxis:  Lovenox  Code Status: Full code  Family Communication: None today. Spoke with patient's daughter Tawanna Sat on the phone yesterday.  Disposition Plan: Likely home by tomorrow if able to wean oxygen..  PT evaluation does not recommend skilled PT.    Consultants:  Cardiology  Procedures:  None  Antibiotics: Anti-infectives (From admission, onward)   Start     Dose/Rate Route Frequency Ordered Stop   09/19/19 1000  remdesivir 100 mg in sodium chloride 0.9 % 100 mL IVPB     100 mg 200 mL/hr over 30 Minutes Intravenous Daily 09/18/19 2021 09/22/19 1000   09/18/19 2030  remdesivir 200 mg in sodium chloride 0.9% 250 mL IVPB     200 mg 580 mL/hr over 30 Minutes Intravenous Once 09/18/19 2021 09/18/19 2156      Subjective: Today, patient denies increasing shortness of breath cough cough, fever or chills   Objective: Vitals:  09/22/19 2043 09/23/19 0500  BP: 105/73 104/73  Pulse: 80   Resp: 19   Temp: 98.9 F (37.2 C) 97.7 F (36.5 C)  SpO2:      Intake/Output Summary (Last 24 hours) at 09/23/2019 0729 Last data filed at 09/22/2019 2102 Gross per 24 hour  Intake 6 ml  Output --  Net 6 ml   Filed Weights   09/18/19 1600 09/19/19 2330 09/20/19 0100  Weight: 72.6 kg 72.5 kg 72.5 kg   Body mass index is 28.32 kg/m.   Physical  Exam:  General: Alert, awake oriented.  Not in obvious distress.  On nasal cannula oxygen. HENT: Normocephalic, pupils equally reacting to light and accommodation.  No scleral pallor or icterus noted. Oral mucosa is moist.  Chest: Diminished breath sounds bilaterally.  No obvious crackles or wheezes. CVS: S1 &S2 heard. No murmur.  Regular rate and rhythm. Abdomen: Soft, nontender, nondistended.  Bowel sounds are heard.  Liver is not palpable, no abdominal mass palpated Extremities: No cyanosis, clubbing or edema.  Peripheral pulses are palpable. Psych: Alert, awake and oriented, normal mood CNS:  No cranial nerve deficits.  Power equal in all extremities.  No sensory deficits noted.  No cerebellar signs.   Skin: Warm and dry.  No rashes noted.  Data Review: I have personally reviewed the following laboratory data and studies,  CBC: Recent Labs  Lab 09/19/19 0043 09/20/19 0146 09/21/19 0500 09/22/19 0521 09/23/19 0352  WBC 6.8 7.9 7.9 7.8 8.2  NEUTROABS 6.1 6.5 7.1 5.9 6.6  HGB 10.8* 11.4* 12.1 12.3 12.3  HCT 33.4* 34.6* 38.2 37.5 36.9  MCV 90.3 89.4 89.7 88.4 87.2  PLT 321 441* 376 498* [id number]*   Basic Metabolic Panel: Recent Labs  Lab 09/19/19 0043 09/20/19 0146 09/21/19 0500 09/22/19 0521 09/23/19 0352  NA 138 138 138 139 137  K 4.1 4.4 4.7 4.6 4.6  CL 100 99 102 102 101  CO2 22 25 20* 25 24  GLUCOSE 264* 306* 238* 237* 232*  BUN 38* 43* 38* 35* 32*  CREATININE 1.43* 1.34* 1.16* 1.08* 1.07*  CALCIUM 8.6* 9.2 9.2 9.4 9.1  MG 2.1 2.1 1.9 1.5* 1.6*   Liver Function Tests: Recent Labs  Lab 09/19/19 0043 09/20/19 0146 09/21/19 0500 09/22/19 0521 09/23/19 0352  AST 28 26 25 20 18   ALT 17 19 16 15 14   ALKPHOS 53 64 64 62 60  BILITOT 0.6 0.3 0.6 0.5 0.4  PROT 6.7 7.3 6.9 6.8 6.4*  ALBUMIN 2.7* 2.9* 3.0* 2.9* 2.7*   No results for input(s): LIPASE, AMYLASE in the last 168 hours. No results for input(s): AMMONIA in the last 168 hours. Cardiac Enzymes: No results  for input(s): CKTOTAL, CKMB, CKMBINDEX, TROPONINI in the last 168 hours. BNP (last 3 results) No results for input(s): BNP in the last 8760 hours.  ProBNP (last 3 results) No results for input(s): PROBNP in the last 8760 hours.  CBG: Recent Labs  Lab 09/22/19 1156 09/22/19 1713 09/22/19 2043 09/23/19 0053 09/23/19 0408  GLUCAP 125* 95 136* 209* 211*   Recent Results (from the past 240 hour(s))  Novel Coronavirus, NAA (Hosp order, Send-out to Ref Lab; TAT 18-24 hrs     Status: Abnormal   Collection Time: 09/14/19  1:17 PM   Specimen: Nasopharyngeal Swab; Respiratory  Result Value Ref Range Status   SARS-CoV-2, NAA DETECTED (A) NOT DETECTED Final    Comment: (NOTE)  Client Requested Flag This nucleic acid amplification test was developed and its performance characteristics determined by [employer]. Nucleic acid amplification tests include PCR and TMA. This test has not been FDA cleared or approved. This test has been authorized by FDA under an Emergency Use Authorization (EUA). This test is only authorized for the duration of time the declaration that circumstances exist justifying the authorization of the emergency use of in vitro diagnostic tests for detection of SARS-CoV-2 virus and/or diagnosis of COVID-19 infection under section 564(b)(1) of the Act, 21 U.S.C. [id number]) (1), unless the authorization is terminated or revoked sooner. When diagnostic testing is negative, the possibility of a false negative result should be considered in the context of a patient's recent exposures and the presence of clinical signs and symptoms consistent with COVID-19. An individual without symptoms of COVID-  19 and who is not shedding SARS-CoV-2 virus would expect to have a negative (not detected) result in this assay. Performed At: [facility] [address] [place], [place] [id number] Rush Farmer MD [id number]    [place]  Final    Comment: Performed at [facility], [place] [address], [place], [place] [address]  Blood Culture (routine x 2)     Status: None (Preliminary result)   Collection Time: 09/18/19  5:00 PM   Specimen: BLOOD  Result Value Ref Range Status   Specimen Description BLOOD RIGHT ANTECUBITAL  Final   Special Requests   Final    BOTTLES DRAWN AEROBIC AND ANAEROBIC Blood Culture results may not be optimal due to an inadequate volume of blood received in culture bottles   Culture   Final    NO GROWTH 4 DAYS Performed at [facility], [place] [address] [place], [place] [address]    Report Status PENDING  Incomplete  Blood Culture (routine x 2)     Status: None (Preliminary result)   Collection Time: 09/18/19  6:40 PM   Specimen: BLOOD  Result Value Ref Range Status   Specimen Description BLOOD LEFT ANTECUBITAL  Final   Special Requests   Final    BOTTLES DRAWN AEROBIC AND ANAEROBIC Blood Culture adequate volume   Culture   Final    NO GROWTH 4 DAYS Performed at [facility], [place] [address], [place], [place] [address]    Report Status PENDING  Incomplete     Studies: No results found.  Scheduled Meds: . aspirin  81 mg Oral Daily  . dexamethasone (DECADRON) injection  6 mg Intravenous Q24H  . enoxaparin (LOVENOX) injection  40 mg Subcutaneous Q24H  . insulin aspart  0-9 Units Subcutaneous Q4H  . insulin glargine  14 Units Subcutaneous Daily  . losartan  50 mg Oral Daily  . magnesium oxide  400 mg Oral BID  . mouth rinse  15 mL Mouth Rinse BID  . pantoprazole  40 mg Oral Daily  . simvastatin  40 mg Oral q1800  . sodium chloride flush  3 mL Intravenous Q12H  . sodium chloride flush  3 mL Intravenous Q12H    Continuous Infusions: . sodium chloride       Flora Lipps, MD  [facility] 09/23/2019

## 2019-09-24 LAB — GLUCOSE, CAPILLARY
Glucose-Capillary: 152 mg/dL — ABNORMAL HIGH (ref 70–99)
Glucose-Capillary: 188 mg/dL — ABNORMAL HIGH (ref 70–99)
Glucose-Capillary: 215 mg/dL — ABNORMAL HIGH (ref 70–99)
Glucose-Capillary: 76 mg/dL (ref 70–99)
Glucose-Capillary: 85 mg/dL (ref 70–99)

## 2019-09-24 MED ORDER — TRAMADOL HCL 50 MG PO TABS: 50.0000 mg | ORAL_TABLET | Freq: Four times a day (QID) | ORAL | 0 refills | Status: DC | PRN

## 2019-09-24 MED ORDER — DEXAMETHASONE 6 MG PO TABS: 6.0000 mg | ORAL_TABLET | Freq: Every day | ORAL | 0 refills | Status: AC

## 2019-09-24 NOTE — Progress Notes (Signed)
Jacqueline Barnett to be D/C'd Home per MD order.  Discussed with the patient and all questions fully answered.  VSS, Skin clean, dry and intact without evidence of skin break down, no evidence of skin tears noted. IV catheter discontinued intact. Site without signs and symptoms of complications. Dressing and pressure applied.  An After Visit Summary was printed and given to the patient. Patient received prescription.  D/c education completed with patient/family including follow up instructions, medication list, d/c activities limitations if indicated, with other d/c instructions as indicated by MD - patient able to verbalize understanding, all questions fully answered.   Patient instructed to return to ED, call 911, or call MD for any changes in condition.   Patient escorted via [place], and D/C home via private auto.  Pt discharge home with oxygen concentrator.  Lorenza Evangelist [facility] 09/24/2019 5:59 PM.

## 2019-09-24 NOTE — Discharge Summary (Signed)
Physician Discharge Summary  Jacqueline Barnett [id number] DOB: 1946-07-07 DOA: 09/18/2019  PCP: Jacqueline Low, MD  Admit date: 09/18/2019 Discharge date: 09/24/2019  Admitted From: Home  Discharge disposition: Home   Recommendations for Outpatient Follow-Up:    Follow up with your primary care provider in one week.  Follow-up with cardiology in 2 to 3 weeks.  Please get a cardiac MRIon 11/05/2019.    Discharge Diagnosis:   Principal Problem:   Pneumonia due to COVID-19 virus Active Problems:   DM2 (diabetes mellitus, type 2) (HCC)   Abnormal ECG   HTN (hypertension)   Elevated troponin   Prolonged QT interval   Acute hypoxemic respiratory failure ([place])   Discharge Condition: Improved.  Diet recommendation: .  Carbohydrate-modified.   Wound care: None.  Code status: Full.   History of Present Illness:   Jacqueline Barnett an 73 y.o.femalewith medical history significant for hypertension, type 2 diabetes mellitus, probable CKD stage III, was referred from the urgent care clinic to the emergency room because of positive coronavirus test. She complained of generalized weakness. In the ED, her oxygen saturation was 88% on room air. X-ray showed multifocal pneumonia due to coronavirus infection.   Hospital Course:  Following conditions were addressed during hospitalization.  Follow-up plan in italics.  COVID-19 pneumonia leading to acute hypoxic respiratory failure on presentation.:  completed remdesivir 5 day course.   Received 5 days of IV dexamethasone which will be changed to oral on discharge.  Patient was initially on 4 L of nasal cannula which has been titrated down to 2 L.  Patient desaturated especially on exertion and would benefit from oxygen on discharge.  Patient will have to follow-up with her primary care physician to titrate oxygen .  Abnormal EKG (Diffuse T wave inversion, QTC prolongation)/mildly elevated troponin: Seen by  cardiologyduring hospitalization. 2D echo showed EF 55 to [id number], grade 1 diastolic dysfunction, no LVH.  Patient received IV heparin initially.Marland Kitchen  She was subsequently switched to Lovenox.  Patient will continuecontinue aspirin and simvastatin on discharge. Cardiology recommends outpatient follow-up and cardiac MRI outpatient.  Appointment for cardiac MRI is on 11/05/2019.    Type 2 diabetes mellituswith hyperglycemia:Hemoglobin A1c is 8.2.  Patient received sliding scale insulin and Lantus during hospitalization..  Patient will resume Jacqueline Barnett and Jacqueline Barnett discharge..  Was advised to check her blood glucose level twice a day at home.  Essential hypertension: Blood pressure remained stable.  Will resume losartan and hydrochlorothiazide as outpatient.  Mild hypomagnesemia.    Was replenished orally while in the hospital.  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  PCP to revise hydrochlorothiazide after discharge and monitor CBC BMP, mg in the next visit.  Disposition.  At this time, patient is stable for disposition home.  Patient will need 2 L of oxygen by nasal cannula on discharge.  She was seen by physical therapy and patient does not need any skilled physical therapy on discharge.  With the patient's daughter Ms. Jacqueline Barnett on the phone and updated her about the clinical condition of the patient and the plan for disposition.   Medical Consultants:    Cardiology   Subjective:   Today, patient feels okay.  Denies dyspnea, chest pain, dizziness, lightheadedness, no nausea vomiting or abdominal pain.  Discharge Exam:   Vitals:   09/24/19 1150 09/24/19 1152  BP:    Pulse:    Resp:    Temp:    SpO2: 94% 94%   Vitals:  09/24/19 0947 09/24/19 1100 09/24/19 1150 09/24/19 1152  BP: 134/74     Pulse: 77     Resp: 17     Temp:      TempSrc:      SpO2: 92% 98% 94% 94%  Weight:      Height:        General exam: Appears calm and comfortable ,Not in  distress, on nasal cannula. HEENT:PERRL,Oral mucosa moist Respiratory system: Bilateral equal air entry, diminished breath sounds bilaterally.   Cardiovascular system: S1 & S2 heard, RRR.  Gastrointestinal system: Abdomen is nondistended, soft and nontender. No organomegaly or masses felt. Normal bowel sounds heard. Central nervous system: Alert and oriented. No focal neurological deficits. Extremities: No edema, no clubbing ,no cyanosis, distal peripheral pulses palpable. Skin: No rashes, lesions or ulcers,no icterus ,no pallor MSK: Normal muscle bulk,tone ,power    Procedures:    None  The results of significant diagnostics from this hospitalization (including imaging, microbiology, ancillary and laboratory) are listed below for reference.     Diagnostic Studies:   DG Chest Port 1 View  Result Date: 09/18/2019 CLINICAL DATA:  Hypoxia.  Weakness. EXAM: PORTABLE CHEST 1 VIEW COMPARISON:  09/06/2016. FINDINGS: There are hazy bilateral peripheral airspace lung opacities, new since the prior exam. No pleural effusion or pneumothorax. Cardiac silhouette is top in size. Aorta is tortuous. No mediastinal or hilar masses. Skeletal structures are grossly intact. IMPRESSION: 1. Hazy bilateral peripheral airspace lung opacities consistent with multifocal pneumonia. The pattern is suspicious for COVID-19 infection Electronically Signed   By: Lajean Manes M.D.   On: 09/18/2019 17:24   ECHOCARDIOGRAM COMPLETE  Result Date: 09/19/2019   ECHOCARDIOGRAM REPORT   Patient Name:   Jacqueline Barnett Date of Exam: 09/19/2019 Medical Rec #:  [id number]              Height:       63.0 in Accession #:    [id number]             Weight:       160.0 lb Date of Birth:  13-Jan-1946               BSA:          1.76 m Patient Age:    18 years               BP:           111/74 mmHg Patient Gender: F                      HR:           70 bpm. Exam Location:  Inpatient Procedure: 2D Echo                              MODIFIED REPORT: This report was modified by Eleonore Chiquito MD on 09/19/2019 due to error.  Indications:     Abnormal ECG R94.31  History:         Patient has no prior history of Echocardiogram examinations.                  Covid-19 Positive; Risk Factors:Hypertension and Diabetes.  Sonographer:     Mikki Santee RDCS (AE) Referring Phys:  [place] Diagnosing Phys: Eleonore Chiquito MD IMPRESSIONS  1. Left ventricular ejection fraction, by visual estimation, is 55 to 60%. The left ventricle has normal function.  There is no left ventricular hypertrophy.  2. Left ventricular diastolic parameters are consistent with Grade I diastolic dysfunction (impaired relaxation).  3. The left ventricle has no regional wall motion abnormalities.  4. Global right ventricle has normal systolic function.The right ventricular size is normal. No increase in right ventricular wall thickness.  5. Left atrial size was normal.  6. Right atrial size was normal.  7. Presence of pericardial fat pad.  8. Trivial pericardial effusion is present.  9. Mild mitral annular calcification. 10. The mitral valve is degenerative. Trivial mitral valve regurgitation. 11. The tricuspid valve is grossly normal. Tricuspid valve regurgitation is trivial. 12. The aortic valve is tricuspid. Aortic valve regurgitation is not visualized. No evidence of aortic valve sclerosis or stenosis. 13. The pulmonic valve was normal in structure. Pulmonic valve regurgitation is not visualized. 14. Normal pulmonary artery systolic pressure. 15. The inferior vena cava is normal in size with greater than 50% respiratory variability, suggesting right atrial pressure of 3 mmHg. 16. No prior Echocardiogram. FINDINGS  Left Ventricle: Left ventricular ejection fraction, by visual estimation, is 55 to 60%. The left ventricle has normal function. The left ventricle has no regional wall motion abnormalities. The left ventricular internal cavity size was the left ventricle  is normal in size. There is no left ventricular hypertrophy. Left ventricular diastolic parameters are consistent with Grade I diastolic dysfunction (impaired relaxation). Normal left atrial pressure. Right Ventricle: The right ventricular size is normal. No increase in right ventricular wall thickness. Global RV systolic function is has normal systolic function. The tricuspid regurgitant velocity is 1.96 m/s, and with an assumed right atrial pressure  of 3 mmHg, the estimated right ventricular systolic pressure is normal at 18.4 mmHg. Left Atrium: Left atrial size was normal in size. Right Atrium: Right atrial size was normal in size Pericardium: Trivial pericardial effusion is present. Presence of pericardial fat pad. Mitral Valve: The mitral valve is degenerative in appearance. Mild mitral annular calcification. Trivial mitral valve regurgitation. Tricuspid Valve: The tricuspid valve is grossly normal. Tricuspid valve regurgitation is trivial. Aortic Valve: The aortic valve is tricuspid. Aortic valve regurgitation is not visualized. The aortic valve is structurally normal, with no evidence of sclerosis or stenosis. Pulmonic Valve: The pulmonic valve was normal in structure. Pulmonic valve regurgitation is not visualized. Pulmonic regurgitation is not visualized. Aorta: The aortic root is normal in size and structure. Venous: The inferior vena cava is normal in size with greater than 50% respiratory variability, suggesting right atrial pressure of 3 mmHg. IAS/Shunts: No atrial level shunt detected by color flow Doppler.  LEFT VENTRICLE PLAX 2D LVIDd:         4.09 cm  Diastology LVIDs:         2.57 cm  LV e' lateral:   5.66 cm/s LV PW:         0.94 cm  LV E/e' lateral: 13.2 LV IVS:        0.78 cm  LV e' medial:    6.20 cm/s LVOT diam:     2.20 cm  LV E/e' medial:  12.0 LV SV:         50 ml LV SV Index:   27.45 LVOT Area:     3.80 cm  RIGHT VENTRICLE RV S prime:     11.50 cm/s TAPSE (M-mode): 2.0 cm LEFT ATRIUM              Index       RIGHT ATRIUM  Index LA diam:        3.70 cm 2.10 cm/m  RA Area:     9.51 cm LA Vol (A2C):   38.3 ml 21.78 ml/m RA Volume:   16.10 ml 9.15 ml/m LA Vol (A4C):   43.2 ml 24.56 ml/m LA Biplane Vol: 40.9 ml 23.26 ml/m   AORTA Ao Root diam: 2.90 cm MITRAL VALVE                        TRICUSPID VALVE MV Area (PHT): 2.73 cm             TR Peak grad:   15.4 mmHg MV PHT:        80.62 msec           TR Vmax:        196.00 cm/s MV Decel Time: 278 msec MV E velocity: 74.60 cm/s 103 cm/s  SHUNTS MV A velocity: 98.10 cm/s 70.3 cm/s Systemic Diam: 2.20 cm MV E/A ratio:  0.76       1.5  Eleonore Chiquito MD Electronically signed by Eleonore Chiquito MD Signature Date/Time: 09/19/2019/4:03:51 PM    Final (Updated)      Labs:   Basic Metabolic Panel: Recent Labs  Lab 09/19/19 0043 09/20/19 0146 09/21/19 0500 09/22/19 0521 09/23/19 0352  NA 138 138 138 139 137  K 4.1 4.4 4.7 4.6 4.6  CL 100 99 102 102 101  CO2 22 25 20* 25 24  GLUCOSE 264* 306* 238* 237* 232*  BUN 38* 43* 38* 35* 32*  CREATININE 1.43* 1.34* 1.16* 1.08* 1.07*  CALCIUM 8.6* 9.2 9.2 9.4 9.1  MG 2.1 2.1 1.9 1.5* 1.6*   GFR Estimated Creatinine Clearance: 44.8 mL/min (A) (by C-G formula based on SCr of 1.07 mg/dL (H)). Liver Function Tests: Recent Labs  Lab 09/19/19 0043 09/20/19 0146 09/21/19 0500 09/22/19 0521 09/23/19 0352  AST 28 26 25 20 18   ALT 17 19 16 15 14   ALKPHOS 53 64 64 62 60  BILITOT 0.6 0.3 0.6 0.5 0.4  PROT 6.7 7.3 6.9 6.8 6.4*  ALBUMIN 2.7* 2.9* 3.0* 2.9* 2.7*   No results for input(s): LIPASE, AMYLASE in the last 168 hours. No results for input(s): AMMONIA in the last 168 hours. Coagulation profile No results for input(s): INR, PROTIME in the last 168 hours.  CBC: Recent Labs  Lab 09/19/19 0043 09/20/19 0146 09/21/19 0500 09/22/19 0521 09/23/19 0352  WBC 6.8 7.9 7.9 7.8 8.2  NEUTROABS 6.1 6.5 7.1 5.9 6.6  HGB 10.8* 11.4* 12.1 12.3 12.3  HCT 33.4* 34.6* 38.2 37.5 36.9  MCV  90.3 89.4 89.7 88.4 87.2  PLT 321 441* 376 498* 457*   Cardiac Enzymes: No results for input(s): CKTOTAL, CKMB, CKMBINDEX, TROPONINI in the last 168 hours. BNP: Invalid input(s): POCBNP CBG: Recent Labs  Lab 09/24/19 0029 09/24/19 0511 09/24/19 0749 09/24/19 1121 09/24/19 1232  GLUCAP 215* 188* 152* 85 76   D-Dimer Recent Labs    09/22/19 0521 09/23/19 0352  DDIMER 0.91* 2.22*   Hgb A1c No results for input(s): HGBA1C in the last 72 hours. Lipid Profile No results for input(s): CHOL, HDL, LDLCALC, TRIG, CHOLHDL, LDLDIRECT in the last 72 hours. Thyroid function studies No results for input(s): TSH, T4TOTAL, T3FREE, THYROIDAB in the last 72 hours.  Invalid input(s): FREET3 Anemia work up Recent Labs    09/22/19 0521 09/23/19 0352  FERRITIN 544* 341*   Microbiology Recent Results (from the past 240 hour(s))  Blood Culture (routine x 2)     Status: None   Collection Time: 09/18/19  5:00 PM   Specimen: BLOOD  Result Value Ref Range Status   Specimen Description BLOOD RIGHT ANTECUBITAL  Final   Special Requests   Final    BOTTLES DRAWN AEROBIC AND ANAEROBIC Blood Culture results may not be optimal due to an inadequate volume of blood received in culture bottles   Culture   Final    NO GROWTH 5 DAYS Performed at [facility], [place] [address], [place], [place] [address]    Report Status 09/23/2019 FINAL  Final  Blood Culture (routine x 2)     Status: None   Collection Time: 09/18/19  6:40 PM   Specimen: BLOOD  Result Value Ref Range Status   Specimen Description BLOOD LEFT ANTECUBITAL  Final   Special Requests   Final    BOTTLES DRAWN AEROBIC AND ANAEROBIC Blood Culture adequate volume   Culture   Final    NO GROWTH 5 DAYS Performed at [facility], [place] [address] [place], [place] [address]    Report Status 09/23/2019 FINAL  Final     Discharge Instructions:   Discharge Instructions    Call MD for:   Complete by: As directed    Worsening  symptoms.   Diet Carb Modified   Complete by: As directed    Discharge instructions   Complete by: As directed    Complete the course of dexamethasone.  Follow-up with your primary care physician in 1 week.  Please check your blood glucose levels twice daily.    You have an appointment for cardiac MRI  on 11/05/2019.  Follow-up with Dr. Jenkins [place] cardiology in 2 to 3 weeks.  Continue oxygen as prescribed..  Follow-up with your primary care physician regarding monitoring of oxygen need in the future.   Increase activity slowly   Complete by: As directed      Allergies as of 09/24/2019   No Known Allergies     Medication List    STOP taking these medications   diclofenac 75 MG EC tablet Commonly known as: VOLTAREN   diclofenac sodium 1 % Gel Commonly known as: VOLTAREN   guaiFENesin-codeine 100-10 MG/5ML syrup Commonly known as: ROBITUSSIN AC   levofloxacin 500 MG tablet Commonly known as: LEVAQUIN   meloxicam 7.5 MG tablet Commonly known as: Mobic     TAKE these medications   Accu-Chek SmartView test strip Generic drug: glucose blood   aspirin 81 MG chewable tablet Chew by mouth daily.   dexamethasone 6 MG tablet Commonly known as: DECADRON Take 1 tablet (6 mg total) by mouth daily for 5 days.   famotidine 20 MG tablet Commonly known as: PEPCID Take 20 mg by mouth 2 (two) times daily.   Farxiga 5 MG Tabs tablet Generic drug: dapagliflozin propanediol Take 5 mg by mouth daily.   hydrochlorothiazide 25 MG tablet Commonly known as: HYDRODIURIL Take 25 mg by mouth daily.   ipratropium 0.06 % nasal spray Commonly known as: Atrovent Place 2 sprays into both nostrils 4 (four) times daily.   losartan 50 MG tablet Commonly known as: COZAAR Take 50 mg by mouth daily.   Jacqueline Barnett 500 MG 24 hr tablet Commonly known as: GLUCOPHAGE-XR Take 1,500 mg by mouth daily with breakfast. What changed: Another medication with the same name was removed. Continue taking this  medication, and follow the directions you see here.   multivitamin capsule Take 1 capsule  by mouth daily.   naproxen 375 MG tablet Commonly known as: NAPROSYN Take 1 tablet (375 mg total) by mouth 2 (two) times daily.   PRILOSEC PO Take by mouth.   simvastatin 40 MG tablet Commonly known as: ZOCOR Take 40 mg by mouth daily at 6 PM.   traMADol 50 MG tablet Commonly known as: ULTRAM Take 1 tablet (50 mg total) by mouth every 6 (six) hours as needed.            Durable Medical Equipment  (From admission, onward)         Start     Ordered   09/24/19 1435  For home use only DME oxygen  Once    Question Answer Comment  Length of Need 6 Months   Mode or (Route) Nasal cannula   Liters per Minute 2   Frequency Continuous (stationary and portable oxygen unit needed)   Oxygen conserving device Yes   Oxygen delivery system Gas      09/24/19 1435            Follow-up Information    Jacqueline Low, MD. Schedule an appointment as soon as possible for a visit in 1 week(s).   Specialty: Internal Medicine Why: For regular checkup and monitoring of blood glucose level. Contact information: [address] [place] [address] [phone]        Josue Hector, MD. Schedule an appointment as soon as possible for a visit.   Specialty: Cardiology Why: 2 to 3 weeks for your cardiac follow-up Contact information: [address] [place] [address] [phone]            Time coordinating discharge: 39 minutes  Signed:  Kinston Magnan  [facility] 09/24/2019, 2:47 PM

## 2019-09-24 NOTE — Progress Notes (Signed)
SATURATION QUALIFICATIONS: (This note is used to comply with regulatory documentation for home oxygen)  Patient Saturations on Room Air at Rest = 89%  Patient Saturations on Room Air while Ambulating = 75%  Patient Saturations on 3 Liters of oxygen while Ambulating = 90%  Please briefly explain why patient needs home oxygen: Pt unable to safely ambulate on RA. Pt requires 3L O2 to maintain SaO2 90%O2/  Ahlia Lemanski B. Migdalia Dk PT, DPT Acute Rehabilitation Services Pager [phone] Office [phone]

## 2019-09-24 NOTE — Care Management Important Message (Signed)
Important Message  Patient Details  Name: Jacqueline Barnett MRN: [id number] Date of Birth: 12-Apr-1946   Medicare Important Message Given:  Yes - Important Message mailed due to current National Emergency  Verbal consent obtained due to current National Emergency  Relationship to patient: Child Contact Name: Verlee Monte Call Date: 09/24/19  Time: 1638 Phone: [phone] Outcome: Spoke with contact Important Message mailed to: Patient address on file    Delorse Lek 09/24/2019, 4:38 PM

## 2019-09-24 NOTE — TOC Transition Note (Signed)
Transition of Care [facility]) - CM/SW Discharge Note   Patient Details  Name: Jacqueline Barnett MRN: [id number] Date of Birth: 02-Sep-1946  [facility] CM/SW Contact:  Maryclare Labrador, RN Phone Number: 09/24/2019, 3:20 PM  Clinical Narrative:   PTA independent from home , family will transport home at discharge.  Pt in agreement with home oxygen - DME choice agency given - pt chose [facility], agency contacted and referral accepted.  [facility] will provide portable oxygen concentrator prior to discharge and arrange home oxygen equipment to be delivered today    Final next level of care: Home/Self Care Barriers to Discharge: Barriers Resolved   Patient Goals and CMS Choice   CMS Medicare.gov Compare Post Acute Care list provided to:: Patient Choice offered to / list presented to : Patient  Discharge Placement                       Discharge Plan and Services                DME Arranged: Oxygen DME Agency: [facility] Date DME Agency Contacted: 09/24/19 Time DME Agency Contacted: [phone] Representative spoke with at DME Agency: [place] ([place]) Interventions     Readmission Risk Interventions No flowsheet data found.

## 2019-09-24 NOTE — Progress Notes (Signed)
Patient was able to be place in prone position and is tolerating it well oxygen sat 94% on 1.5 L N/C patient educated on deep breathing. Will continue to monitor no complaints of SOB or discomfort.

## 2019-09-24 NOTE — Progress Notes (Signed)
Physical Therapy Treatment Patient Details Name: Jacqueline Barnett MRN: [id number] DOB: 12-Jun-1946 Today's Date: 09/24/2019    History of Present Illness Pt adm with acute hypoxic respiratory failure due to covid PNA. PMH - HTN, DM, CKD,     PT Comments    Pt is hopeful for discharge home this afternoon and is will to participate in ambulating to determine supplemental O2 need for home (see Ambulating Oxygen Saturation note for details.) Pt is limited by her O2 need in presence of generalized weakness. Pt is mod I for bed mobility and transfers and min guard for mild instability with ambulation of 250 feet without assistive device. Pt educated on need for hourly mobilization at home to keep up strength and endurance.    Follow Up Recommendations  No PT follow up     Equipment Recommendations  Other (comment)(O2)       Precautions / Restrictions Precautions Precautions: Other (comment) Precaution Comments: watch SpO2 Restrictions Weight Bearing Restrictions: No    Mobility  Bed Mobility Overal bed mobility: Modified Independent             General bed mobility comments: Incr time  Transfers Overall transfer level: Needs assistance Equipment used: None Transfers: Sit to/from Stand Sit to Stand: Min guard         General transfer comment: Assist for safety and lines  Ambulation/Gait Ambulation/Gait assistance: Min guard Gait Distance (Feet): 200 Feet Assistive device: None Gait Pattern/deviations: Step-through pattern;Decreased stride length Gait velocity: decr Gait velocity interpretation: 1.31 - 2.62 ft/sec, indicative of limited community ambulator General Gait Details: Assist for safety and lines. Pt slightly unsteady but no overt loss of balance       Balance Overall balance assessment: Needs assistance Sitting-balance support: No upper extremity supported;Feet supported Sitting balance-Leahy Scale: Good     Standing balance support: No upper  extremity supported;During functional activity Standing balance-Leahy Scale: Fair                              Cognition Arousal/Alertness: Awake/alert Behavior During Therapy: WFL for tasks assessed/performed Overall Cognitive Status: Within Functional Limits for tasks assessed                                           General Comments General comments (skin integrity, edema, etc.): ambulated on 3 L O2 via [place] to maintain SaO2 >90%       Pertinent Vitals/Pain Pain Assessment: No/denies pain           PT Goals (current goals can now be found in the care plan section) Acute Rehab PT Goals Patient Stated Goal: return home PT Goal Formulation: With patient Time For Goal Achievement: 10/06/19 Potential to Achieve Goals: Good Progress towards PT goals: Progressing toward goals    Frequency    Min 3X/week      PT Plan Current plan remains appropriate       AM-PAC PT "6 Clicks" Mobility   Outcome Measure  Help needed turning from your back to your side while in a flat bed without using bedrails?: None Help needed moving from lying on your back to sitting on the side of a flat bed without using bedrails?: None Help needed moving to and from a bed to a chair (including a wheelchair)?: A Little Help needed standing up from a chair  using your arms (e.g., wheelchair or bedside chair)?: A Little Help needed to walk in hospital room?: A Little Help needed climbing 3-5 steps with a railing? : A Little 6 Click Score: 20    End of Session Equipment Utilized During Treatment: Oxygen Activity Tolerance: Patient limited by fatigue Patient left: in bed;with call bell/phone within reach   PT Visit Diagnosis: Unsteadiness on feet (R26.81);Other abnormalities of gait and mobility (R26.89);Muscle weakness (generalized) (M62.81)     Time: [id number] PT Time Calculation (min) (ACUTE ONLY): 22 min  Charges:  $Gait Training: 8-22 mins                      Maelyn Berrey B. Migdalia Dk PT, DPT Acute Rehabilitation Services Pager [phone] Office [phone]    [place] 09/24/2019, 4:43 PM

## 2019-09-24 NOTE — Progress Notes (Signed)
Patient portable oxygen concentrator deliver in the room, and was educated on oxygen usage.

## 2019-09-25 ENCOUNTER — Encounter: Payer: Self-pay | Admitting: *Deleted

## 2019-09-25 ENCOUNTER — Telehealth: Payer: Self-pay | Admitting: *Deleted

## 2019-09-25 NOTE — Telephone Encounter (Signed)
Patient aware of Cardiac MRI scheduled 10/19/19 at 9:00 am at [facility]---arrival time 12:15pm 1st floor admissions office --Will also mail information to patient

## 2019-10-09 DIAGNOSIS — J1282 Pneumonia due to coronavirus disease 2019: Secondary | ICD-10-CM | POA: Diagnosis not present

## 2019-10-09 DIAGNOSIS — R9431 Abnormal electrocardiogram [ECG] [EKG]: Secondary | ICD-10-CM | POA: Diagnosis not present

## 2019-10-09 DIAGNOSIS — U071 COVID-19: Secondary | ICD-10-CM | POA: Diagnosis not present

## 2019-10-18 ENCOUNTER — Telehealth (HOSPITAL_COMMUNITY): Payer: Self-pay | Admitting: Emergency Medicine

## 2019-10-18 NOTE — Telephone Encounter (Signed)
Left message on voicemail with name and callback number Shourya Macpherson RN Navigator Cardiac Imaging [facility] [phone] Office [phone] Cell  

## 2019-10-19 ENCOUNTER — Ambulatory Visit (HOSPITAL_COMMUNITY)
Admission: RE | Admit: 2019-10-19 | Discharge: 2019-10-19 | Disposition: A | Discharge status: Home / Self Care | Payer: Medicare HMO | Source: Ambulatory Visit | Attending: Cardiovascular Disease | Admitting: Cardiovascular Disease

## 2019-10-19 ENCOUNTER — Other Ambulatory Visit: Payer: Self-pay

## 2019-10-19 DIAGNOSIS — I514 Myocarditis, unspecified: Secondary | ICD-10-CM | POA: Insufficient documentation

## 2019-10-19 MED ORDER — GADOBUTROL 1 MMOL/ML IV SOLN
7.0000 mL | Freq: Once | INTRAVENOUS | Status: AC | PRN
  Administered 2019-10-19: 7 mL via INTRAVENOUS

## 2019-10-25 ENCOUNTER — Other Ambulatory Visit: Payer: Self-pay | Admitting: Internal Medicine

## 2019-10-25 ENCOUNTER — Ambulatory Visit
Admission: RE | Admit: 2019-10-25 | Discharge: 2019-10-25 | Disposition: A | Discharge status: Home / Self Care | Payer: Medicare HMO | Source: Ambulatory Visit | Attending: Internal Medicine | Admitting: Internal Medicine

## 2019-10-25 ENCOUNTER — Telehealth: Payer: Self-pay | Admitting: Cardiovascular Disease

## 2019-10-25 DIAGNOSIS — R0789 Other chest pain: Secondary | ICD-10-CM

## 2019-10-25 DIAGNOSIS — R0781 Pleurodynia: Secondary | ICD-10-CM

## 2019-10-25 NOTE — Telephone Encounter (Signed)
Looks like Jacqueline Barnett left message for the pt.

## 2019-10-25 NOTE — Telephone Encounter (Signed)
Called patient back with her CT results. See note in results.

## 2019-10-25 NOTE — Telephone Encounter (Signed)
Patient returning call for test results

## 2019-10-31 NOTE — Progress Notes (Signed)
CARDIOLOGY OFFICE NOTE  Date:  11/05/2019    Jacqueline Barnett Date of Birth: Sep 16, 1946 Medical Record [id number]  PCP:  Wenda Low, MD  Cardiologist:  Johnsie Cancel (NEW)  Chief Complaint  Patient presents with  . Hospitalization Follow-up    Seen for Dr. Johnsie Cancel    History of Present Illness: Jacqueline Barnett is a 74 y.o. female who presents today for a post hospital visit. Seen for Dr. Johnsie Cancel.   She has a history of HTN, HLD, DM, COPD and prior tobacco abuse.   Presented to the hospital in December with acute respiratory failure - + for COVID 19 infection. EKG was abnormal. Prolonged QT. No known cardiac history. No delta troponin.  Echo was obtained - EF 55 to 60%. Was to have cardiac MRI in 2 to 3 weeks and look for myocarditis - this was done last month and turned out ok. Treated with Remdesivir.   The patient does not have symptoms concerning for COVID-19 infection (fever, chills, cough, or new shortness of breath).   Comes in today. Here alone. Doing ok. She notes she is feeling much better. Rare spell of being short of breath. Not dizzy. No chest pain. She is back doing all her regular activities. She has seen her PCP already and has follow up there again next month. She tells me she stopped smoking 2 years ago.   Past Medical History:  Diagnosis Date  . Diabetes mellitus without complication ([place])   . Hypercholesteremia   . Hypertension     Past Surgical History:  Procedure Laterality Date  . ABDOMINAL HYSTERECTOMY    . FOOT SURGERY       Medications: Current Meds  Medication Sig  . ACCU-CHEK SMARTVIEW test strip   . aspirin 81 MG chewable tablet Chew by mouth daily.  . famotidine (PEPCID) 20 MG tablet Take 20 mg by mouth 2 (two) times daily.  Marland Kitchen FARXIGA 5 MG TABS tablet Take 5 mg by mouth daily.  . hydrochlorothiazide (HYDRODIURIL) 25 MG tablet Take 25 mg by mouth daily.  Marland Kitchen losartan (COZAAR) 50 MG tablet Take 50 mg by mouth daily.  . metFORMIN  (GLUCOPHAGE-XR) 500 MG 24 hr tablet Take 1,500 mg by mouth daily with breakfast.  . Multiple Vitamin (MULTIVITAMIN) capsule Take 1 capsule by mouth daily.  . naproxen (NAPROSYN) 375 MG tablet Take 1 tablet (375 mg total) by mouth 2 (two) times daily.  . Omeprazole (PRILOSEC PO) Take by mouth.  . simvastatin (ZOCOR) 40 MG tablet Take 40 mg by mouth daily at 6 PM.      Allergies: No Known Allergies  Social History: The patient  reports that she has been smoking. She has never used smokeless tobacco. She reports that she does not drink alcohol.   Family History: The patient's family history includes Diabetes in an other family member. Mother died with ovarian cancer in her early 13's. Father was diabetic but lived to be 18.   Review of Systems: Please see the history of present illness.   All other systems are reviewed and negative.   Physical Exam: VS:  BP 124/68   Pulse 93   Ht 5\' 3"  (1.6 m)   Wt 158 lb (71.7 kg)   SpO2 98%   BMI 27.99 kg/m  .  BMI Body mass index is 27.99 kg/m.  Wt Readings from Last 3 Encounters:  11/05/19 158 lb (71.7 kg)  09/24/19 165 lb 12.6 oz (75.2 kg)    General:  Alert and in no acute distress. She is obese. Weight is down.   HEENT: Normal.  Neck: Supple, no JVD, carotid bruits, or masses noted.  Cardiac: Regular rate and rhythm. No murmurs, rubs, or gallops. No edema.  Respiratory:  Lungs are clear to auscultation bilaterally with normal work of breathing.  GI: Soft and nontender.  MS: No deformity or atrophy. Gait and ROM intact.  Skin: Warm and dry. Color is normal.  Neuro:  Strength and sensation are intact and no gross focal deficits noted.  Psych: Alert, appropriate and with normal affect.   LABORATORY DATA:  EKG:  EKG is ordered today. This demonstrates NSR - QT is much improved at 384 ms today. Her T wave changes anteriorly are also much improved today as well when compared to last tracing.   Lab Results  Component Value Date   WBC  8.2 09/23/2019   HGB 12.3 09/23/2019   HCT 36.9 09/23/2019   PLT 457 (H) 09/23/2019   GLUCOSE 232 (H) 09/23/2019   CHOL 138 09/20/2019   TRIG 88 09/20/2019   HDL 54 09/20/2019   LDLCALC 66 09/20/2019   ALT 14 09/23/2019   AST 18 09/23/2019   NA 137 09/23/2019   K 4.6 09/23/2019   CL 101 09/23/2019   CREATININE 1.07 (H) 09/23/2019   BUN 32 (H) 09/23/2019   CO2 24 09/23/2019   HGBA1C 8.2 (H) 09/18/2019     BNP (last 3 results) No results for input(s): BNP in the last 8760 hours.  ProBNP (last 3 results) No results for input(s): PROBNP in the last 8760 hours.   Other Studies Reviewed Today:  CARDIAC MRI IMPRESSION 10/2019: 1. Normal biventricular chamber size and function. LVEF 55%, RVEF 53%.  2. No definite postcontrast delayed myocardial enhancement. T1 pre and post contrast values and ECV suggest no definite infiltrative process. No scar, infarction, fibrosis, or inflammation noted.   Electronically Signed   By: Cherlynn Kaiser   On: 10/25/2019 05:54  ECHO IMPRESSIONS 09/2019   1. Left ventricular ejection fraction, by visual estimation, is 55 to 60%. The left ventricle has normal function. There is no left ventricular hypertrophy.  2. Left ventricular diastolic parameters are consistent with Grade I diastolic dysfunction (impaired relaxation).  3. The left ventricle has no regional wall motion abnormalities.  4. Global right ventricle has normal systolic function.The right ventricular size is normal. No increase in right ventricular wall thickness.  5. Left atrial size was normal.  6. Right atrial size was normal.  7. Presence of pericardial fat pad.  8. Trivial pericardial effusion is present.  9. Mild mitral annular calcification. 10. The mitral valve is degenerative. Trivial mitral valve regurgitation. 11. The tricuspid valve is grossly normal. Tricuspid valve regurgitation is trivial. 12. The aortic valve is tricuspid. Aortic valve regurgitation is  not visualized. No evidence of aortic valve sclerosis or stenosis. 13. The pulmonic valve was normal in structure. Pulmonic valve regurgitation is not visualized. 14. Normal pulmonary artery systolic pressure. 15. The inferior vena cava is normal in size with greater than 50% respiratory variability, suggesting right atrial pressure of 3 mmHg. 16. No prior Echocardiogram.  Assessment/Plan:  1. Prior abnormal EKG in the setting of COVID 19 infection - in the setting of poor oral intake - multiple CV risk factors that incude HTN, HLD, DM2, age, tobacco abuse and aortic atherosclerosis on prior CT of the chest - her echo showed normal EF and no wall motion abnormalities and structurally reassuring. If echo  was abnormal then it was recommended that she has ischemic evaluation - have recommended aggressive CV risk factor modification.   2. Prior prolonged QT - repeat EKG today with great improvement - now normal - T wave changes are also much improved. She has no symptoms.   3. HTN - BP is fine on current regimen.   4. HLD - on statin therapy. Labs by PCP.   5. DM - per PCP  6. Tobacco abuse - has not smoked in 2 years. Continued cessation encouraged.   7. COVID-19 Education: The signs and symptoms of COVID-19 were discussed with the patient and how to seek care for testing (follow up with PCP or arrange E-visit).  The importance of social distancing, staying at home, hand hygiene and wearing a mask when out in public were discussed today.  Current medicines are reviewed with the patient today.  The patient does not have concerns regarding medicines other than what has been noted above.  The following changes have been made:  See above.  Labs/ tests ordered today include:    Orders Placed This Encounter  Procedures  . EKG 12-Lead     Disposition:   FU with [place] in about 4 months to recheck and then probably just prn.   Patient is agreeable to this plan and will call if any problems  develop in the interim.   SignedTruitt Merle, NP  11/05/2019 12:43 PM  [facility] [address] [place], [place]  [address] Phone: [phone] Fax: [phone]

## 2019-11-05 ENCOUNTER — Ambulatory Visit: Payer: Medicare HMO | Admitting: Nurse Practitioner

## 2019-11-05 ENCOUNTER — Encounter: Payer: Self-pay | Admitting: Nurse Practitioner

## 2019-11-05 ENCOUNTER — Other Ambulatory Visit: Payer: Self-pay

## 2019-11-05 VITALS — BP 124/68 | HR 93 | Ht 63.0 in | Wt 158.0 lb

## 2019-11-05 DIAGNOSIS — Z7189 Other specified counseling: Secondary | ICD-10-CM

## 2019-11-05 DIAGNOSIS — I10 Essential (primary) hypertension: Secondary | ICD-10-CM | POA: Diagnosis not present

## 2019-11-05 DIAGNOSIS — I514 Myocarditis, unspecified: Secondary | ICD-10-CM | POA: Diagnosis not present

## 2019-11-05 DIAGNOSIS — R9431 Abnormal electrocardiogram [ECG] [EKG]: Secondary | ICD-10-CM

## 2019-11-05 DIAGNOSIS — E7800 Pure hypercholesterolemia, unspecified: Secondary | ICD-10-CM | POA: Diagnosis not present

## 2019-11-05 NOTE — Patient Instructions (Addendum)
After Visit Summary:  We will be checking the following labs today - NONE   Medication Instructions:    Continue with your current medicines.    If you need a refill on your cardiac medications before your next appointment, please call your pharmacy.     Testing/Procedures To Be Arranged:  N/A  Follow-Up:   See Dr. Johnsie Cancel in about 4 months    At [facility], you and your health needs are our priority.  As part of our continuing mission to provide you with exceptional heart care, we have created designated Provider Care Teams.  These Care Teams include your primary Cardiologist (physician) and Advanced Practice Providers (APPs -  Physician Assistants and Nurse Practitioners) who all work together to provide you with the care you need, when you need it.  Special Instructions:  . Stay safe, stay home, wash your hands for at least 20 seconds and wear a mask when out in public.  . It was good to talk with you today.    Call the [place] office at [phone] if you have any questions, problems or concerns.

## 2020-01-21 NOTE — Progress Notes (Signed)
CARDIOLOGY OFFICE NOTE  Date:  01/28/2020    Jacqueline Barnett Date of Birth: Feb 12, 1946 Medical Record [id number]  PCP:  Jacqueline Low, MD  Cardiologist:  Jacqueline Barnett (NEW)  No chief complaint on file.   History of Present Illness: Jacqueline Barnett is a 74 y.o. female who presents today for a post hospital visit   She has a history of HTN, HLD, DM, COPD and prior tobacco abuse.   Presented to the hospital in December 2020  with acute respiratory failure - + for COVID 19 infection. EKG was abnormal. Prolonged QT. No known cardiac history. No delta troponin.  Echo was obtained - EF 55 to 60%. Cardiac MRI 10/19/19 with no myocarditis  Treated with Remdesivir.   She  stopped smoking 2 years ago.   Denies  Palpitations, syncope, dyspnea or chest pain Compliant with meds  Lives with one daughter and the other is next door  No chest pain   Past Medical History:  Diagnosis Date  . Diabetes mellitus without complication ([place])   . Hypercholesteremia   . Hypertension     Past Surgical History:  Procedure Laterality Date  . ABDOMINAL HYSTERECTOMY    . FOOT SURGERY       Medications: Current Meds  Medication Sig  . ACCU-CHEK SMARTVIEW test strip   . Ascorbic Acid (VITAMIN C PO) Take by mouth.  Marland Kitchen aspirin 81 MG chewable tablet Chew by mouth daily.  . famotidine (PEPCID) 20 MG tablet Take 20 mg by mouth 2 (two) times daily.  . hydrochlorothiazide (HYDRODIURIL) 25 MG tablet Take 25 mg by mouth daily.  Marland Kitchen losartan (COZAAR) 50 MG tablet Take 50 mg by mouth daily.  . metFORMIN (GLUCOPHAGE-XR) 500 MG 24 hr tablet Take 1,500 mg by mouth daily with breakfast.  . Omega-3 Fatty Acids (FISH OIL OMEGA-3 PO) Take by mouth.  . Omeprazole (PRILOSEC PO) Take by mouth.  . simvastatin (ZOCOR) 40 MG tablet Take 40 mg by mouth daily at 6 PM.   . VITAMIN D, CHOLECALCIFEROL, PO Take by mouth.  . [DISCONTINUED] FARXIGA 5 MG TABS tablet Take 5 mg by mouth daily.     Allergies: No  Known Allergies  Social History: The patient  reports that she has been smoking. She has never used smokeless tobacco. She reports that she does not drink alcohol.   Family History: The patient's family history includes Diabetes in an other family member. Mother died with ovarian cancer in her early 39's. Father was diabetic but lived to be 10.   Review of Systems: Please see the history of present illness.   All other systems are reviewed and negative.   Physical Exam: VS:  BP 124/78   Pulse 68   Ht 5\' 3"  (1.6 m)   Wt 157 lb (71.2 kg)   SpO2 98%   BMI 27.81 kg/m  .  BMI Body mass index is 27.81 kg/m.  Wt Readings from Last 3 Encounters:  01/28/20 157 lb (71.2 kg)  11/05/19 158 lb (71.7 kg)  09/24/19 165 lb 12.6 oz (75.2 kg)   Affect appropriate Healthy:  appears stated age HEENT: normal Neck supple with no adenopathy JVP normal no bruits no thyromegaly Lungs clear with no wheezing and good diaphragmatic motion Heart:  S1/S2 no murmur, no rub, gallop or click PMI normal Abdomen: benighn, BS positve, no tenderness, no AAA no bruit.  No HSM or HJR Distal pulses intact with no bruits No edema Neuro non-focal Skin warm and dry  No muscular weakness   LABORATORY DATA:  EKG:  EKG 11/05/19  This demonstrates NSR - QT is much improved at 384 ms today. Her T wave changes anteriorly are also much improved today as well when compared to last tracing.    Lab Results  Component Value Date   WBC 8.2 09/23/2019   HGB 12.3 09/23/2019   HCT 36.9 09/23/2019   PLT 457 (H) 09/23/2019   GLUCOSE 232 (H) 09/23/2019   CHOL 138 09/20/2019   TRIG 88 09/20/2019   HDL 54 09/20/2019   LDLCALC 66 09/20/2019   ALT 14 09/23/2019   AST 18 09/23/2019   NA 137 09/23/2019   K 4.6 09/23/2019   CL 101 09/23/2019   CREATININE 1.07 (H) 09/23/2019   BUN 32 (H) 09/23/2019   CO2 24 09/23/2019   HGBA1C 8.2 (H) 09/18/2019     BNP (last 3 results) No results for input(s): BNP in the last 8760  hours.  ProBNP (last 3 results) No results for input(s): PROBNP in the last 8760 hours.   Other Studies Reviewed Today:  CARDIAC MRI IMPRESSION 10/2019: 1. Normal biventricular chamber size and function. LVEF 55%, RVEF 53%.  2. No definite postcontrast delayed myocardial enhancement. T1 pre and post contrast values and ECV suggest no definite infiltrative process. No scar, infarction, fibrosis, or inflammation noted.   Electronically Signed   By: Jacqueline Barnett   On: 10/25/2019 05:54  ECHO IMPRESSIONS 09/2019   1. Left ventricular ejection fraction, by visual estimation, is 55 to 60%. The left ventricle has normal function. There is no left ventricular hypertrophy.  2. Left ventricular diastolic parameters are consistent with Grade I diastolic dysfunction (impaired relaxation).  3. The left ventricle has no regional wall motion abnormalities.  4. Global right ventricle has normal systolic function.The right ventricular size is normal. No increase in right ventricular wall thickness.  5. Left atrial size was normal.  6. Right atrial size was normal.  7. Presence of pericardial fat pad.  8. Trivial pericardial effusion is present.  9. Mild mitral annular calcification. 10. The mitral valve is degenerative. Trivial mitral valve regurgitation. 11. The tricuspid valve is grossly normal. Tricuspid valve regurgitation is trivial. 12. The aortic valve is tricuspid. Aortic valve regurgitation is not visualized. No evidence of aortic valve sclerosis or stenosis. 13. The pulmonic valve was normal in structure. Pulmonic valve regurgitation is not visualized. 14. Normal pulmonary artery systolic pressure. 15. The inferior vena cava is normal in size with greater than 50% respiratory variability, suggesting right atrial pressure of 3 mmHg. 16. No prior Echocardiogram.  Assessment/Plan:  1. Prior abnormal EKG in the setting of COVID 19 infection and multiple CV risk factors that  incude HTN, HLD, DM2, age, tobacco abuse and aortic atherosclerosis on prior CT of the chest - her echo showed normal EF and no wall motion abnormalities and structurally reassuring. Cardiac MRI done 10/19/19 with EF 55% no myocarditis, normal post contrast images   2. Prior prolonged QT - related to COVID  Normal at 384 msec ECG done 11/05/19   3. HTN - Well controlled.  Continue current medications and Barnett sodium Dash type diet.    4. HLD - on statin therapy. Labs by PCP. LDL 66 labs 09/20/19   5. DM - Discussed Barnett carb diet.  Target hemoglobin A1c is 6.5 or less.  Continue current medications.  6. Tobacco abuse - has not smoked in 2 years. Continued cessation encouraged.   Current medicines are reviewed  with the patient today.  The patient does not have concerns regarding medicines other than what has been noted above.  The following changes have been made:  See above.  Labs/ tests ordered today include: none    No orders of the defined types were placed in this encounter.    Disposition:   FU with [place] in a year   Patient is agreeable to this plan and will call if any problems develop in the interim.   Signed: Jenkins Rouge, MD  01/28/2020 10:43 AM  [facility] [address] [place], [place]  [address] Phone: [phone] Fax: [phone]

## 2020-01-28 ENCOUNTER — Ambulatory Visit: Payer: Medicare HMO | Admitting: Cardiovascular Disease

## 2020-01-28 ENCOUNTER — Encounter: Payer: Self-pay | Admitting: Cardiovascular Disease

## 2020-01-28 ENCOUNTER — Other Ambulatory Visit: Payer: Self-pay

## 2020-01-28 VITALS — BP 124/78 | HR 68 | Ht 63.0 in | Wt 157.0 lb

## 2020-01-28 DIAGNOSIS — R9431 Abnormal electrocardiogram [ECG] [EKG]: Secondary | ICD-10-CM

## 2020-01-28 NOTE — Patient Instructions (Signed)

## 2020-01-30 DIAGNOSIS — E785 Hyperlipidemia, unspecified: Secondary | ICD-10-CM | POA: Diagnosis not present

## 2020-01-30 DIAGNOSIS — Z7984 Long term (current) use of oral hypoglycemic drugs: Secondary | ICD-10-CM | POA: Diagnosis not present

## 2020-01-30 DIAGNOSIS — K219 Gastro-esophageal reflux disease without esophagitis: Secondary | ICD-10-CM | POA: Diagnosis not present

## 2020-01-30 DIAGNOSIS — I5189 Other ill-defined heart diseases: Secondary | ICD-10-CM | POA: Diagnosis not present

## 2020-01-30 DIAGNOSIS — E1165 Type 2 diabetes mellitus with hyperglycemia: Secondary | ICD-10-CM | POA: Diagnosis not present

## 2020-01-30 DIAGNOSIS — J869 Pyothorax without fistula: Secondary | ICD-10-CM | POA: Diagnosis not present

## 2020-01-30 DIAGNOSIS — I10 Essential (primary) hypertension: Secondary | ICD-10-CM | POA: Diagnosis not present

## 2020-01-30 DIAGNOSIS — I700 Atherosclerosis of aorta: Secondary | ICD-10-CM | POA: Diagnosis not present

## 2020-05-19 ENCOUNTER — Other Ambulatory Visit: Payer: Self-pay | Admitting: Internal Medicine

## 2020-05-19 DIAGNOSIS — Z1231 Encounter for screening mammogram for malignant neoplasm of breast: Secondary | ICD-10-CM

## 2020-06-26 ENCOUNTER — Ambulatory Visit: Payer: Medicare HMO

## 2020-07-22 ENCOUNTER — Ambulatory Visit
Admission: RE | Admit: 2020-07-22 | Discharge: 2020-07-22 | Disposition: A | Discharge status: Home / Self Care | Payer: Medicare HMO | Source: Ambulatory Visit | Attending: Internal Medicine | Admitting: Internal Medicine

## 2020-07-22 ENCOUNTER — Other Ambulatory Visit: Payer: Self-pay

## 2020-07-22 DIAGNOSIS — Z1231 Encounter for screening mammogram for malignant neoplasm of breast: Secondary | ICD-10-CM

## 2020-10-17 DIAGNOSIS — E1169 Type 2 diabetes mellitus with other specified complication: Secondary | ICD-10-CM | POA: Diagnosis not present

## 2020-10-17 DIAGNOSIS — I10 Essential (primary) hypertension: Secondary | ICD-10-CM | POA: Diagnosis not present

## 2020-10-17 DIAGNOSIS — E1165 Type 2 diabetes mellitus with hyperglycemia: Secondary | ICD-10-CM | POA: Diagnosis not present

## 2020-10-17 DIAGNOSIS — E785 Hyperlipidemia, unspecified: Secondary | ICD-10-CM | POA: Diagnosis not present

## 2020-10-17 DIAGNOSIS — M8580 Other specified disorders of bone density and structure, unspecified site: Secondary | ICD-10-CM | POA: Diagnosis not present

## 2020-10-17 DIAGNOSIS — E119 Type 2 diabetes mellitus without complications: Secondary | ICD-10-CM | POA: Diagnosis not present

## 2020-10-17 DIAGNOSIS — K219 Gastro-esophageal reflux disease without esophagitis: Secondary | ICD-10-CM | POA: Diagnosis not present

## 2020-10-17 DIAGNOSIS — J439 Emphysema, unspecified: Secondary | ICD-10-CM | POA: Diagnosis not present

## 2020-12-17 DIAGNOSIS — E1169 Type 2 diabetes mellitus with other specified complication: Secondary | ICD-10-CM | POA: Diagnosis not present

## 2020-12-17 DIAGNOSIS — M8580 Other specified disorders of bone density and structure, unspecified site: Secondary | ICD-10-CM | POA: Diagnosis not present

## 2020-12-17 DIAGNOSIS — J439 Emphysema, unspecified: Secondary | ICD-10-CM | POA: Diagnosis not present

## 2020-12-17 DIAGNOSIS — I10 Essential (primary) hypertension: Secondary | ICD-10-CM | POA: Diagnosis not present

## 2020-12-17 DIAGNOSIS — E119 Type 2 diabetes mellitus without complications: Secondary | ICD-10-CM | POA: Diagnosis not present

## 2020-12-17 DIAGNOSIS — E785 Hyperlipidemia, unspecified: Secondary | ICD-10-CM | POA: Diagnosis not present

## 2020-12-17 DIAGNOSIS — K219 Gastro-esophageal reflux disease without esophagitis: Secondary | ICD-10-CM | POA: Diagnosis not present

## 2020-12-17 DIAGNOSIS — E1165 Type 2 diabetes mellitus with hyperglycemia: Secondary | ICD-10-CM | POA: Diagnosis not present

## 2021-01-28 DIAGNOSIS — E1169 Type 2 diabetes mellitus with other specified complication: Secondary | ICD-10-CM | POA: Diagnosis not present

## 2021-01-28 DIAGNOSIS — K219 Gastro-esophageal reflux disease without esophagitis: Secondary | ICD-10-CM | POA: Diagnosis not present

## 2021-01-28 DIAGNOSIS — E1165 Type 2 diabetes mellitus with hyperglycemia: Secondary | ICD-10-CM | POA: Diagnosis not present

## 2021-01-28 DIAGNOSIS — E119 Type 2 diabetes mellitus without complications: Secondary | ICD-10-CM | POA: Diagnosis not present

## 2021-01-28 DIAGNOSIS — I10 Essential (primary) hypertension: Secondary | ICD-10-CM | POA: Diagnosis not present

## 2021-01-28 DIAGNOSIS — M8580 Other specified disorders of bone density and structure, unspecified site: Secondary | ICD-10-CM | POA: Diagnosis not present

## 2021-01-28 DIAGNOSIS — J439 Emphysema, unspecified: Secondary | ICD-10-CM | POA: Diagnosis not present

## 2021-01-28 DIAGNOSIS — E785 Hyperlipidemia, unspecified: Secondary | ICD-10-CM | POA: Diagnosis not present

## 2021-01-29 DIAGNOSIS — K219 Gastro-esophageal reflux disease without esophagitis: Secondary | ICD-10-CM | POA: Diagnosis not present

## 2021-01-29 DIAGNOSIS — E1169 Type 2 diabetes mellitus with other specified complication: Secondary | ICD-10-CM | POA: Diagnosis not present

## 2021-01-29 DIAGNOSIS — I10 Essential (primary) hypertension: Secondary | ICD-10-CM | POA: Diagnosis not present

## 2021-01-29 DIAGNOSIS — J439 Emphysema, unspecified: Secondary | ICD-10-CM | POA: Diagnosis not present

## 2021-01-29 DIAGNOSIS — E042 Nontoxic multinodular goiter: Secondary | ICD-10-CM | POA: Diagnosis not present

## 2021-01-29 DIAGNOSIS — I700 Atherosclerosis of aorta: Secondary | ICD-10-CM | POA: Diagnosis not present

## 2021-01-29 DIAGNOSIS — E785 Hyperlipidemia, unspecified: Secondary | ICD-10-CM | POA: Diagnosis not present

## 2021-01-29 DIAGNOSIS — I5189 Other ill-defined heart diseases: Secondary | ICD-10-CM | POA: Diagnosis not present

## 2021-01-29 DIAGNOSIS — K5904 Chronic idiopathic constipation: Secondary | ICD-10-CM | POA: Diagnosis not present

## 2021-01-30 DIAGNOSIS — I10 Essential (primary) hypertension: Secondary | ICD-10-CM | POA: Diagnosis not present

## 2021-03-03 DIAGNOSIS — I10 Essential (primary) hypertension: Secondary | ICD-10-CM | POA: Diagnosis not present

## 2021-03-19 DIAGNOSIS — K219 Gastro-esophageal reflux disease without esophagitis: Secondary | ICD-10-CM | POA: Diagnosis not present

## 2021-03-19 DIAGNOSIS — E1165 Type 2 diabetes mellitus with hyperglycemia: Secondary | ICD-10-CM | POA: Diagnosis not present

## 2021-03-19 DIAGNOSIS — E1169 Type 2 diabetes mellitus with other specified complication: Secondary | ICD-10-CM | POA: Diagnosis not present

## 2021-03-19 DIAGNOSIS — J439 Emphysema, unspecified: Secondary | ICD-10-CM | POA: Diagnosis not present

## 2021-03-19 DIAGNOSIS — E785 Hyperlipidemia, unspecified: Secondary | ICD-10-CM | POA: Diagnosis not present

## 2021-03-19 DIAGNOSIS — I10 Essential (primary) hypertension: Secondary | ICD-10-CM | POA: Diagnosis not present

## 2021-03-19 DIAGNOSIS — M8580 Other specified disorders of bone density and structure, unspecified site: Secondary | ICD-10-CM | POA: Diagnosis not present

## 2021-04-02 DIAGNOSIS — I10 Essential (primary) hypertension: Secondary | ICD-10-CM | POA: Diagnosis not present

## 2021-04-12 NOTE — Progress Notes (Deleted)
CARDIOLOGY OFFICE NOTE  Date:  04/12/2021    [name] Date of Birth: 28-Jan-1946 Medical Record #[id number]  PCP:  Wenda Low, MD  Cardiologist:  Johnsie Cancel    No chief complaint on file.   History of Present Illness: Jacqueline Barnett is a 75 y.o. female who presents today for f/u  She has a history of HTN, HLD, DM, COPD and prior tobacco abuse.   Presented to the hospital in December 2020  with acute respiratory failure - + for COVID 19 infection. EKG was abnormal. Prolonged QT. No known cardiac history. No delta troponin.  Echo was obtained - EF 55 to 60%. Cardiac MRI 10/19/19 with no myocarditis  Treated with Remdesivir.   She  stopped smoking 3 years ago.   Denies  Palpitations, syncope, dyspnea or chest pain Compliant with meds  Lives with one daughter and the other is next door  No chest pain   ***  Past Medical History:  Diagnosis Date   Diabetes mellitus without complication ([place])    Hypercholesteremia    Hypertension     Past Surgical History:  Procedure Laterality Date   ABDOMINAL HYSTERECTOMY     FOOT SURGERY       Medications: No outpatient medications have been marked as taking for the 04/21/21 encounter (Appointment) with Josue Hector, MD.     Allergies: No Known Allergies  Social History: The patient  reports that she has been smoking. She has never used smokeless tobacco. She reports that she does not drink alcohol.   Family History: The patient's family history includes Diabetes in an other family member. Mother died with ovarian cancer in her early 63's. Father was diabetic but lived to be 52.   Review of Systems: Please see the history of present illness.   All other systems are reviewed and negative.   Physical Exam: VS:  There were no vitals taken for this visit. Marland Kitchen  BMI There is no height or weight on file to calculate BMI.  Wt Readings from Last 3 Encounters:  01/28/20 71.2 kg  11/05/19 71.7 kg  09/24/19  75.2 kg   Affect appropriate Healthy:  appears stated age 23: normal Neck supple with no adenopathy JVP normal no bruits no thyromegaly Lungs clear with no wheezing and good diaphragmatic motion Heart:  S1/S2 no murmur, no rub, gallop or click PMI normal Abdomen: benighn, BS positve, no tenderness, no AAA no bruit.  No HSM or HJR Distal pulses intact with no bruits No edema Neuro non-focal Skin warm and dry No muscular weakness   LABORATORY DATA:  EKG:  EKG 11/05/19  This demonstrates NSR - QT is much improved at 384 ms today. Her T wave changes anteriorly are also much improved today as well when compared to last tracing.    Lab Results  Component Value Date   WBC 8.2 09/23/2019   HGB 12.3 09/23/2019   HCT 36.9 09/23/2019   PLT 457 (H) 09/23/2019   GLUCOSE 232 (H) 09/23/2019   CHOL 138 09/20/2019   TRIG 88 09/20/2019   HDL 54 09/20/2019   LDLCALC 66 09/20/2019   ALT 14 09/23/2019   AST 18 09/23/2019   NA 137 09/23/2019   K 4.6 09/23/2019   CL 101 09/23/2019   CREATININE 1.07 (H) 09/23/2019   BUN 32 (H) 09/23/2019   CO2 24 09/23/2019   HGBA1C 8.2 (H) 09/18/2019     BNP (last 3 results) No results for input(s): BNP in  the last 8760 hours.  ProBNP (last 3 results) No results for input(s): PROBNP in the last 8760 hours.   Other Studies Reviewed Today:  CARDIAC MRI IMPRESSION 10/2019: 1. Normal biventricular chamber size and function. LVEF 55%, RVEF 53%.   2. No definite postcontrast delayed myocardial enhancement. T1 pre and post contrast values and ECV suggest no definite infiltrative process. No scar, infarction, fibrosis, or inflammation noted.     Electronically Signed   By: Cherlynn Kaiser   On: 10/25/2019 05:54  ECHO IMPRESSIONS 09/2019     1. Left ventricular ejection fraction, by visual estimation, is 55 to 60%. The left ventricle has normal function. There is no left ventricular hypertrophy.  2. Left ventricular diastolic parameters are  consistent with Grade I diastolic dysfunction (impaired relaxation).  3. The left ventricle has no regional wall motion abnormalities.  4. Global right ventricle has normal systolic function.The right ventricular size is normal. No increase in right ventricular wall thickness.  5. Left atrial size was normal.  6. Right atrial size was normal.  7. Presence of pericardial fat pad.  8. Trivial pericardial effusion is present.  9. Mild mitral annular calcification. 10. The mitral valve is degenerative. Trivial mitral valve regurgitation. 11. The tricuspid valve is grossly normal. Tricuspid valve regurgitation is trivial. 12. The aortic valve is tricuspid. Aortic valve regurgitation is not visualized. No evidence of aortic valve sclerosis or stenosis. 13. The pulmonic valve was normal in structure. Pulmonic valve regurgitation is not visualized. 14. Normal pulmonary artery systolic pressure. 15. The inferior vena cava is normal in size with greater than 50% respiratory variability, suggesting right atrial pressure of 3 mmHg. 16. No prior Echocardiogram.  Assessment/Plan:  1. Prior abnormal EKG in the setting of COVID 19 infection and multiple CV risk factors that incude HTN, HLD, DM2, age, tobacco abuse and aortic atherosclerosis on prior CT of the chest - her echo showed normal EF and no wall motion abnormalities and structurally reassuring. Cardiac MRI done 10/19/19 with EF 55% no myocarditis, normal post contrast images   2. Prior prolonged QT - related to COVID  Normal at 384 msec ECG done 11/05/19   3. HTN - Well controlled.  Continue current medications and low sodium Dash type diet.    4. HLD - on statin therapy. Labs by PCP. LDL 66 labs 09/20/19   5. DM - Discussed low carb diet.  Target hemoglobin A1c is 6.5 or less.  Continue current medications.  6. Tobacco abuse - has not smoked in 2 years. Continued cessation encouraged.    Current medicines are reviewed with the patient today.   The patient does not have concerns regarding medicines other than what has been noted above.  The following changes have been made:  See above.  Labs/ tests ordered today include: none    No orders of the defined types were placed in this encounter.    Disposition:   FU with [place] in a year   Patient is agreeable to this plan and will call if any problems develop in the interim.   Signed: Jenkins Rouge, MD  04/12/2021 12:05 PM  [facility] [address] [place], [place]  [address] Phone: [phone] Fax: [phone]

## 2021-04-21 ENCOUNTER — Ambulatory Visit: Payer: Medicare Other | Admitting: Cardiovascular Disease

## 2021-04-23 DIAGNOSIS — E119 Type 2 diabetes mellitus without complications: Secondary | ICD-10-CM | POA: Diagnosis not present

## 2021-04-23 DIAGNOSIS — K219 Gastro-esophageal reflux disease without esophagitis: Secondary | ICD-10-CM | POA: Diagnosis not present

## 2021-04-23 DIAGNOSIS — E1169 Type 2 diabetes mellitus with other specified complication: Secondary | ICD-10-CM | POA: Diagnosis not present

## 2021-04-23 DIAGNOSIS — M8580 Other specified disorders of bone density and structure, unspecified site: Secondary | ICD-10-CM | POA: Diagnosis not present

## 2021-04-23 DIAGNOSIS — I10 Essential (primary) hypertension: Secondary | ICD-10-CM | POA: Diagnosis not present

## 2021-04-23 DIAGNOSIS — E785 Hyperlipidemia, unspecified: Secondary | ICD-10-CM | POA: Diagnosis not present

## 2021-04-23 DIAGNOSIS — J439 Emphysema, unspecified: Secondary | ICD-10-CM | POA: Diagnosis not present

## 2021-04-23 DIAGNOSIS — E1165 Type 2 diabetes mellitus with hyperglycemia: Secondary | ICD-10-CM | POA: Diagnosis not present

## 2021-05-01 DIAGNOSIS — I10 Essential (primary) hypertension: Secondary | ICD-10-CM | POA: Diagnosis not present

## 2021-05-25 DIAGNOSIS — Z961 Presence of intraocular lens: Secondary | ICD-10-CM | POA: Diagnosis not present

## 2021-05-25 DIAGNOSIS — E119 Type 2 diabetes mellitus without complications: Secondary | ICD-10-CM | POA: Diagnosis not present

## 2021-05-25 DIAGNOSIS — H43813 Vitreous degeneration, bilateral: Secondary | ICD-10-CM | POA: Diagnosis not present

## 2021-05-26 DIAGNOSIS — I700 Atherosclerosis of aorta: Secondary | ICD-10-CM | POA: Diagnosis not present

## 2021-05-26 DIAGNOSIS — I10 Essential (primary) hypertension: Secondary | ICD-10-CM | POA: Diagnosis not present

## 2021-05-26 DIAGNOSIS — J869 Pyothorax without fistula: Secondary | ICD-10-CM | POA: Diagnosis not present

## 2021-05-26 DIAGNOSIS — Z7984 Long term (current) use of oral hypoglycemic drugs: Secondary | ICD-10-CM | POA: Diagnosis not present

## 2021-05-26 DIAGNOSIS — I5189 Other ill-defined heart diseases: Secondary | ICD-10-CM | POA: Diagnosis not present

## 2021-05-26 DIAGNOSIS — E1169 Type 2 diabetes mellitus with other specified complication: Secondary | ICD-10-CM | POA: Diagnosis not present

## 2021-05-26 DIAGNOSIS — E042 Nontoxic multinodular goiter: Secondary | ICD-10-CM | POA: Diagnosis not present

## 2021-05-26 DIAGNOSIS — K219 Gastro-esophageal reflux disease without esophagitis: Secondary | ICD-10-CM | POA: Diagnosis not present

## 2021-05-26 DIAGNOSIS — E785 Hyperlipidemia, unspecified: Secondary | ICD-10-CM | POA: Diagnosis not present

## 2021-05-26 DIAGNOSIS — M8588 Other specified disorders of bone density and structure, other site: Secondary | ICD-10-CM | POA: Diagnosis not present

## 2021-05-28 ENCOUNTER — Other Ambulatory Visit: Payer: Self-pay | Admitting: Internal Medicine

## 2021-05-28 DIAGNOSIS — M8580 Other specified disorders of bone density and structure, unspecified site: Secondary | ICD-10-CM

## 2021-06-01 DIAGNOSIS — K219 Gastro-esophageal reflux disease without esophagitis: Secondary | ICD-10-CM | POA: Diagnosis not present

## 2021-06-01 DIAGNOSIS — I10 Essential (primary) hypertension: Secondary | ICD-10-CM | POA: Diagnosis not present

## 2021-06-01 DIAGNOSIS — E785 Hyperlipidemia, unspecified: Secondary | ICD-10-CM | POA: Diagnosis not present

## 2021-06-01 DIAGNOSIS — E1169 Type 2 diabetes mellitus with other specified complication: Secondary | ICD-10-CM | POA: Diagnosis not present

## 2021-06-01 DIAGNOSIS — E119 Type 2 diabetes mellitus without complications: Secondary | ICD-10-CM | POA: Diagnosis not present

## 2021-06-01 DIAGNOSIS — M8580 Other specified disorders of bone density and structure, unspecified site: Secondary | ICD-10-CM | POA: Diagnosis not present

## 2021-06-01 DIAGNOSIS — J439 Emphysema, unspecified: Secondary | ICD-10-CM | POA: Diagnosis not present

## 2021-06-01 DIAGNOSIS — E1165 Type 2 diabetes mellitus with hyperglycemia: Secondary | ICD-10-CM | POA: Diagnosis not present

## 2021-06-03 DIAGNOSIS — I10 Essential (primary) hypertension: Secondary | ICD-10-CM | POA: Diagnosis not present

## 2021-06-15 ENCOUNTER — Other Ambulatory Visit: Payer: Self-pay | Admitting: Internal Medicine

## 2021-06-15 DIAGNOSIS — Z1231 Encounter for screening mammogram for malignant neoplasm of breast: Secondary | ICD-10-CM

## 2021-06-29 ENCOUNTER — Encounter (HOSPITAL_COMMUNITY): Payer: Self-pay | Admitting: Emergency Medicine

## 2021-06-29 ENCOUNTER — Ambulatory Visit (HOSPITAL_COMMUNITY)
Admission: EM | Admit: 2021-06-29 | Discharge: 2021-06-29 | Disposition: A | Discharge status: Home / Self Care | Payer: Medicare Other | Attending: Emergency Medicine | Admitting: Emergency Medicine

## 2021-06-29 ENCOUNTER — Other Ambulatory Visit: Payer: Self-pay

## 2021-06-29 DIAGNOSIS — J069 Acute upper respiratory infection, unspecified: Secondary | ICD-10-CM

## 2021-06-29 DIAGNOSIS — Z79899 Other long term (current) drug therapy: Secondary | ICD-10-CM | POA: Diagnosis not present

## 2021-06-29 DIAGNOSIS — M545 Low back pain, unspecified: Secondary | ICD-10-CM | POA: Diagnosis not present

## 2021-06-29 DIAGNOSIS — F17200 Nicotine dependence, unspecified, uncomplicated: Secondary | ICD-10-CM | POA: Insufficient documentation

## 2021-06-29 DIAGNOSIS — Z7984 Long term (current) use of oral hypoglycemic drugs: Secondary | ICD-10-CM | POA: Insufficient documentation

## 2021-06-29 DIAGNOSIS — U071 COVID-19: Secondary | ICD-10-CM | POA: Insufficient documentation

## 2021-06-29 DIAGNOSIS — J029 Acute pharyngitis, unspecified: Secondary | ICD-10-CM | POA: Diagnosis present

## 2021-06-29 MED ORDER — FLUTICASONE PROPIONATE 50 MCG/ACT NA SUSP: 2.0000 | Freq: Every day | NASAL | 2 refills | Status: AC

## 2021-06-29 NOTE — Discharge Instructions (Addendum)
Stop using Dayquil and Nyquil - they aren't good medicines to take with high blood pressure. Start using Mucinex (generic guaifenesin is okay to use) for congestion and Delsym (generic dextromethorphan is okay to use) for cough instead.  You can also take Tylenol as directed on the package as needed for pain.  Try a saline nasal spray to help relieve some of your congestion. Try Flonase nasal spray for your congestion (you can buy it over the counter - I have prescribed it as sometimes insurance will pay for it)

## 2021-06-29 NOTE — ED Triage Notes (Signed)
Back pain and cough for 5 days.

## 2021-06-29 NOTE — ED Provider Notes (Signed)
[facility]    CSN: [id number] Arrival date & time: 06/29/21  1648      History   Chief Complaint Chief Complaint  Patient presents with   Back Pain    HPI Jacqueline Barnett is a 75 y.o. female. Patient reports cold symptoms for a week.  She does not remember which symptoms came first, but she complains of mild sore throat, nasal congestion, headache, cough.  Denies shortness of breath or wheezing.  Had [place] previously in 2020 and this does not feel like that.  She does feel like she is starting to get better.  She has been using DayQuil and NyQuil to treat her symptoms along with Tylenol.  She also complains of left lower back pain that she thinks is related to coughing.  She denies specific back injury.  Denies fever denies chills.    Back Pain Associated symptoms: no fever    Past Medical History:  Diagnosis Date   Diabetes mellitus without complication ([place])    Hypercholesteremia    Hypertension     Patient Active Problem List   Diagnosis Date Noted   Prolonged QT interval 09/19/2019   Acute hypoxemic respiratory failure ([place]) 09/19/2019   Pneumonia due to COVID-19 virus 09/18/2019   DM2 (diabetes mellitus, type 2) ([place]) 09/18/2019   Abnormal ECG 09/18/2019   HTN (hypertension) 09/18/2019   Elevated troponin 09/18/2019    Past Surgical History:  Procedure Laterality Date   ABDOMINAL HYSTERECTOMY     FOOT SURGERY      OB History   No obstetric history on file.      Home Medications    Prior to Admission medications   Medication Sig Start Date End Date Taking? Authorizing Provider  ACCU-CHEK SMARTVIEW test strip  08/27/19   [provider]  Ascorbic Acid (VITAMIN C PO) Take by mouth.    [provider]  aspirin 81 MG chewable tablet Chew by mouth daily.    [provider]  famotidine (PEPCID) 20 MG tablet Take 20 mg by mouth 2 (two) times daily. 05/28/19   [provider]  hydrochlorothiazide  (HYDRODIURIL) 25 MG tablet Take 25 mg by mouth daily.    [provider]  losartan (COZAAR) 50 MG tablet Take 50 mg by mouth daily.    [provider]  metFORMIN (GLUCOPHAGE-XR) 500 MG 24 hr tablet Take 1,500 mg by mouth daily with breakfast.    [provider]  Omega-3 Fatty Acids (FISH OIL OMEGA-3 PO) Take by mouth.    [provider]  Omeprazole (PRILOSEC PO) Take by mouth.    [provider]  simvastatin (ZOCOR) 40 MG tablet Take 40 mg by mouth daily at 6 PM.  10/12/16   [provider]  VITAMIN D, CHOLECALCIFEROL, PO Take by mouth.    [provider]    Family History Family History  Problem Relation Age of Onset   Diabetes Other     Social History Social History   Tobacco Use   Smoking status: Every Day   Smokeless tobacco: Never  Substance Use Topics   Alcohol use: No     Allergies   Patient has no known allergies.   Review of Systems Review of Systems  Constitutional:  Negative for chills and fever.  HENT:  Positive for congestion, postnasal drip, rhinorrhea and sore throat. Negative for sinus pressure.   Respiratory:  Positive for cough. Negative for shortness of breath.   Musculoskeletal:  Positive for back pain.  Physical Exam Triage Vital Signs ED Triage Vitals [06/29/21 1810]  Enc Vitals Group     BP (!) 156/71     Pulse Rate 66     Resp 18     Temp 97.9 F (36.6 C)     Temp src      SpO2 96 %     Weight      Height      Head Circumference      Peak Flow      Pain Score 8     Pain Loc      Pain Edu?      Excl. in [place]?    No data found.  Updated Vital Signs BP (!) 156/71   Pulse 66   Temp 97.9 F (36.6 C)   Resp 18   SpO2 96%   Visual Acuity Right Eye Distance:   Left Eye Distance:   Bilateral Distance:    Right Eye Near:   Left Eye Near:    Bilateral Near:     Physical Exam Constitutional:      Appearance: Normal appearance. She is obese. She is not ill-appearing.   HENT:     Right Ear: Tympanic membrane, ear canal and external ear normal.     Left Ear: Tympanic membrane, ear canal and external ear normal.     Nose: Congestion present. No rhinorrhea.     Mouth/Throat:     Mouth: Mucous membranes are moist.     Pharynx: Oropharynx is clear.  Cardiovascular:     Rate and Rhythm: Normal rate and regular rhythm.  Pulmonary:     Effort: Pulmonary effort is normal.     Breath sounds: Normal breath sounds.     Comments: Occasional cough during exam.  Musculoskeletal:     Comments: R lower back minimally tender to palpation. No spinal tenderness to palpation  Skin:    General: Skin is warm and dry.     Findings: No rash.  Neurological:     Mental Status: She is alert.     UC Treatments / Results  Labs (all labs ordered are listed, but only abnormal results are displayed) Labs Reviewed  SARS CORONAVIRUS 2 (TAT 6-24 HRS)    EKG   Radiology No results found.  Procedures Procedures (including critical care time)  Medications Ordered in UC Medications - No data to display  Initial Impression / Assessment and Plan / UC Course  I have reviewed the triage vital signs and the nursing notes.  Pertinent labs & imaging results that were available during my care of the patient were reviewed by me and considered in my medical decision making (see chart for details).  Pt with viral URI that is improving per history. Will test pt for COVID. Made supportive care recommendations.     Final Clinical Impressions(s) / UC Diagnoses   Final diagnoses:  Viral URI with cough     Discharge Instructions      Stop using Dayquil and Nyquil. Start using Mucinex (generic guaifenesin is okay to use) for congestion and Delsym (generic dextromethorphan is okay to use) for cough instead.  You can also take Tylenol as directed on the package as needed for pain.  Try a saline nasal spray to help relieve some of your congestion.    ED Prescriptions   None     PDMP not reviewed this encounter.   Carvel Getting, NP 06/29/21 1900

## 2021-06-30 LAB — SARS CORONAVIRUS 2 (TAT 6-24 HRS): SARS Coronavirus 2: POSITIVE — AB

## 2021-07-27 ENCOUNTER — Ambulatory Visit: Payer: Medicare Other

## 2021-07-28 ENCOUNTER — Ambulatory Visit
Admission: RE | Admit: 2021-07-28 | Discharge: 2021-07-28 | Disposition: A | Discharge status: Home / Self Care | Payer: Medicare Other | Source: Ambulatory Visit | Attending: Internal Medicine | Admitting: Internal Medicine

## 2021-07-28 ENCOUNTER — Other Ambulatory Visit: Payer: Self-pay

## 2021-07-28 DIAGNOSIS — Z1231 Encounter for screening mammogram for malignant neoplasm of breast: Secondary | ICD-10-CM

## 2021-08-03 DIAGNOSIS — I10 Essential (primary) hypertension: Secondary | ICD-10-CM | POA: Diagnosis not present

## 2021-08-25 NOTE — Progress Notes (Signed)
CARDIOLOGY OFFICE NOTE  Date:  09/03/2021    [name] Date of Birth: [birth year] Medical Record #[id number]  PCP:  Wenda Low, MD  Cardiologist:  Johnsie Cancel ([place])  No chief complaint on file.   History of Present Illness: Jacqueline Barnett is a 75 y.o. female who presents today for a post hospital visit   She has a history of HTN, HLD, DM, COPD and prior tobacco abuse.   Presented to the hospital in December 2020  with acute respiratory failure - + for COVID 19 infection. EKG was abnormal. Prolonged QT. No known cardiac history. No delta troponin.  Echo was obtained - EF 55 to 60%. Cardiac MRI 10/19/19 with no myocarditis  Treated with Remdesivir.   She  stopped smoking 2 years ago.   Denies  Palpitations, syncope, dyspnea or chest pain Compliant with meds  Lives with one daughter and the other is next door  No chest pain   Active takes care of great grand daughter after school   Originally from [place] has not been back there since [place] with family there She does not drive   Past Medical History:  Diagnosis Date   Diabetes mellitus without complication ([place])    Hypercholesteremia    Hypertension     Past Surgical History:  Procedure Laterality Date   ABDOMINAL HYSTERECTOMY     FOOT SURGERY       Medications: Current Meds  Medication Sig   ACCU-CHEK SMARTVIEW test strip    Ascorbic Acid (VITAMIN C PO) Take by mouth.   aspirin 81 MG chewable tablet Chew by mouth daily.   dapagliflozin propanediol (FARXIGA) 10 MG TABS tablet Take 10 mg by mouth daily.   famotidine (PEPCID) 20 MG tablet Take 20 mg by mouth 2 (two) times daily.   fluticasone (FLONASE) 50 MCG/ACT nasal spray Place 2 sprays into both nostrils daily.   hydrochlorothiazide (HYDRODIURIL) 25 MG tablet Take 25 mg by mouth daily.   losartan (COZAAR) 100 MG tablet Take 100 mg by mouth daily.   metFORMIN (GLUCOPHAGE-XR) 500 MG 24 hr tablet Take 1,500 mg by mouth daily with  breakfast.   Multiple Vitamins-Minerals (MULTIVITAMIN ADULTS 50+) TABS Take 1 tablet by mouth daily.   nystatin cream (MYCOSTATIN) as needed.   Omega-3 Fatty Acids (FISH OIL OMEGA-3 PO) Take by mouth.   Omeprazole (PRILOSEC PO) Take by mouth.   simvastatin (ZOCOR) 40 MG tablet Take 40 mg by mouth daily at 6 PM.    VITAMIN D, CHOLECALCIFEROL, PO Take by mouth.     Allergies: No Known Allergies  Social History: The patient  reports that she has been smoking. She has never used smokeless tobacco. She reports that she does not drink alcohol.   Family History: The patient's family history includes Diabetes in an other family member. Mother died with ovarian cancer in her early 51's. Father was diabetic but lived to be 53.   Review of Systems: Please see the history of present illness.   All other systems are reviewed and negative.   Physical Exam: VS:  BP 112/72   Pulse 69   Ht 5\' 3"  (1.6 m)   Wt 160 lb 9.6 oz (72.8 kg)   SpO2 95%   BMI 28.45 kg/m  .  BMI Body mass index is 28.45 kg/m.  Wt Readings from Last 3 Encounters:  09/03/21 160 lb 9.6 oz (72.8 kg)  01/28/20 157 lb (71.2 kg)  11/05/19 158 lb (71.7 kg)  Affect appropriate Healthy:  appears stated age 64: normal Neck supple with no adenopathy JVP normal no bruits no thyromegaly Lungs clear with no wheezing and good diaphragmatic motion Heart:  S1/S2 no murmur, no rub, gallop or click PMI normal Abdomen: benighn, BS positve, no tenderness, no AAA no bruit.  No HSM or HJR Distal pulses intact with no bruits No edema Neuro non-focal Skin warm and dry No muscular weakness   LABORATORY DATA:  EKG:   SR rate 69 normal QT 406 msec    Lab Results  Component Value Date   WBC 8.2 09/23/2019   HGB 12.3 09/23/2019   HCT 36.9 09/23/2019   PLT 457 (H) 09/23/2019   GLUCOSE 232 (H) 09/23/2019   CHOL 138 09/20/2019   TRIG 88 09/20/2019   HDL 54 09/20/2019   LDLCALC 66 09/20/2019   ALT 14 09/23/2019   AST 18  09/23/2019   NA 137 09/23/2019   K 4.6 09/23/2019   CL 101 09/23/2019   CREATININE 1.07 (H) 09/23/2019   BUN 32 (H) 09/23/2019   CO2 24 09/23/2019   HGBA1C 8.2 (H) 09/18/2019     BNP (last 3 results) No results for input(s): BNP in the last 8760 hours.  ProBNP (last 3 results) No results for input(s): PROBNP in the last 8760 hours.   Other Studies Reviewed Today:  CARDIAC MRI IMPRESSION 10/2019: 1. Normal biventricular chamber size and function. LVEF 55%, RVEF 53%.   2. No definite postcontrast delayed myocardial enhancement. T1 pre and post contrast values and ECV suggest no definite infiltrative process. No scar, infarction, fibrosis, or inflammation noted.     Electronically Signed   By: Cherlynn Kaiser   On: 10/25/2019 05:54  ECHO IMPRESSIONS 09/2019     1. Left ventricular ejection fraction, by visual estimation, is 55 to 60%. The left ventricle has normal function. There is no left ventricular hypertrophy.  2. Left ventricular diastolic parameters are consistent with Grade I diastolic dysfunction (impaired relaxation).  3. The left ventricle has no regional wall motion abnormalities.  4. Global right ventricle has normal systolic function.The right ventricular size is normal. No increase in right ventricular wall thickness.  5. Left atrial size was normal.  6. Right atrial size was normal.  7. Presence of pericardial fat pad.  8. Trivial pericardial effusion is present.  9. Mild mitral annular calcification. 10. The mitral valve is degenerative. Trivial mitral valve regurgitation. 11. The tricuspid valve is grossly normal. Tricuspid valve regurgitation is trivial. 12. The aortic valve is tricuspid. Aortic valve regurgitation is not visualized. No evidence of aortic valve sclerosis or stenosis. 13. The pulmonic valve was normal in structure. Pulmonic valve regurgitation is not visualized. 14. Normal pulmonary artery systolic pressure. 15. The inferior vena cava  is normal in size with greater than 50% respiratory variability, suggesting right atrial pressure of 3 mmHg. 16. No prior Echocardiogram.  Assessment/Plan:  1. Prior abnormal EKG in the setting of COVID 19 infection and multiple CV risk factors that incude HTN, HLD, DM2, age, tobacco abuse and aortic atherosclerosis on prior CT of the chest - her echo showed normal EF and no wall motion abnormalities and structurally reassuring. Cardiac MRI done 10/19/19 with EF 55% no myocarditis, normal post contrast images    2. Prior prolonged QT - related to COVID  Normal at 384 msec ECG done 11/05/19 and Ok today at 406 msec   3. HTN - Well controlled.  Continue current medications and low sodium Dash type  diet.    4. HLD - on statin therapy. Labs by PCP. LDL 66 labs 09/20/19   5. DM - Discussed low carb diet.  Target hemoglobin A1c is 6.5 or less.  Continue current medications.  6. Tobacco abuse - has not smoked in 2 years. Continued cessation encouraged.    Current medicines are reviewed with the patient today.  The patient does not have concerns regarding medicines other than what has been noted above.  The following changes have been made:  See above.  Labs/ tests ordered today include: None    No orders of the defined types were placed in this encounter.    Disposition:   FU with [place] in a year   Patient is agreeable to this plan and will call if any problems develop in the interim.   Signed: Jenkins Rouge, MD  09/03/2021 9:45 AM  [facility] [address] [place], [place]  [address] Phone: [phone] Fax: [phone]

## 2021-09-02 DIAGNOSIS — I10 Essential (primary) hypertension: Secondary | ICD-10-CM | POA: Diagnosis not present

## 2021-09-03 ENCOUNTER — Encounter: Payer: Self-pay | Admitting: Cardiovascular Disease

## 2021-09-03 ENCOUNTER — Ambulatory Visit: Payer: Medicare Other | Admitting: Cardiovascular Disease

## 2021-09-03 ENCOUNTER — Other Ambulatory Visit: Payer: Self-pay

## 2021-09-03 VITALS — BP 112/72 | HR 69 | Ht 63.0 in | Wt 160.6 lb

## 2021-09-03 DIAGNOSIS — I514 Myocarditis, unspecified: Secondary | ICD-10-CM

## 2021-09-03 DIAGNOSIS — R6889 Other general symptoms and signs: Secondary | ICD-10-CM | POA: Diagnosis not present

## 2021-09-03 DIAGNOSIS — R9431 Abnormal electrocardiogram [ECG] [EKG]: Secondary | ICD-10-CM

## 2021-09-03 NOTE — Patient Instructions (Signed)
Medication Instructions:  Your physician recommends that you continue on your current medications as directed. Please refer to the Current Medication list given to you today.  *If you need a refill on your cardiac medications before your next appointment, please call your pharmacy*   Lab Work: NONE If you have labs (blood work) drawn today and your tests are completely normal, you will receive your results only by: [place] (if you have MyChart) OR A paper copy in the mail If you have any lab test that is abnormal or we need to change your treatment, we will call you to review the results.   Testing/Procedures: NONE   Follow-Up: At [facility], you and your health needs are our priority.  As part of our continuing mission to provide you with exceptional heart care, we have created designated Provider Care Teams.  These Care Teams include your primary Cardiologist (physician) and Advanced Practice Providers (APPs -  Physician Assistants and Nurse Practitioners) who all work together to provide you with the care you need, when you need it.  We recommend signing up for the patient portal called "MyChart".  Sign up information is provided on this After Visit Summary.  MyChart is used to connect with patients for Virtual Visits (Telemedicine).  Patients are able to view lab/test results, encounter notes, upcoming appointments, etc.  Non-urgent messages can be sent to your provider as well.   To learn more about what you can do with MyChart, go to [email or web address].    Your next appointment:   1 year(s)  The format for your next appointment:   In Person  Provider:   Jenkins Rouge, MD {

## 2021-09-08 DIAGNOSIS — K5904 Chronic idiopathic constipation: Secondary | ICD-10-CM | POA: Diagnosis not present

## 2021-09-08 DIAGNOSIS — R6889 Other general symptoms and signs: Secondary | ICD-10-CM | POA: Diagnosis not present

## 2021-09-14 DIAGNOSIS — M8580 Other specified disorders of bone density and structure, unspecified site: Secondary | ICD-10-CM | POA: Diagnosis not present

## 2021-09-14 DIAGNOSIS — E785 Hyperlipidemia, unspecified: Secondary | ICD-10-CM | POA: Diagnosis not present

## 2021-09-14 DIAGNOSIS — J439 Emphysema, unspecified: Secondary | ICD-10-CM | POA: Diagnosis not present

## 2021-09-14 DIAGNOSIS — K219 Gastro-esophageal reflux disease without esophagitis: Secondary | ICD-10-CM | POA: Diagnosis not present

## 2021-09-14 DIAGNOSIS — E1165 Type 2 diabetes mellitus with hyperglycemia: Secondary | ICD-10-CM | POA: Diagnosis not present

## 2021-09-14 DIAGNOSIS — E1169 Type 2 diabetes mellitus with other specified complication: Secondary | ICD-10-CM | POA: Diagnosis not present

## 2021-09-14 DIAGNOSIS — N1831 Chronic kidney disease, stage 3a: Secondary | ICD-10-CM | POA: Diagnosis not present

## 2021-09-14 DIAGNOSIS — I10 Essential (primary) hypertension: Secondary | ICD-10-CM | POA: Diagnosis not present

## 2021-09-14 DIAGNOSIS — E1122 Type 2 diabetes mellitus with diabetic chronic kidney disease: Secondary | ICD-10-CM | POA: Diagnosis not present

## 2021-10-12 DIAGNOSIS — Z0000 Encounter for general adult medical examination without abnormal findings: Secondary | ICD-10-CM | POA: Diagnosis not present

## 2021-10-12 DIAGNOSIS — E785 Hyperlipidemia, unspecified: Secondary | ICD-10-CM | POA: Diagnosis not present

## 2021-10-12 DIAGNOSIS — E1122 Type 2 diabetes mellitus with diabetic chronic kidney disease: Secondary | ICD-10-CM | POA: Diagnosis not present

## 2021-10-12 DIAGNOSIS — K219 Gastro-esophageal reflux disease without esophagitis: Secondary | ICD-10-CM | POA: Diagnosis not present

## 2021-10-12 DIAGNOSIS — M8580 Other specified disorders of bone density and structure, unspecified site: Secondary | ICD-10-CM | POA: Diagnosis not present

## 2021-10-12 DIAGNOSIS — N1831 Chronic kidney disease, stage 3a: Secondary | ICD-10-CM | POA: Diagnosis not present

## 2021-10-12 DIAGNOSIS — E042 Nontoxic multinodular goiter: Secondary | ICD-10-CM | POA: Diagnosis not present

## 2021-10-12 DIAGNOSIS — I700 Atherosclerosis of aorta: Secondary | ICD-10-CM | POA: Diagnosis not present

## 2021-10-12 DIAGNOSIS — Z1389 Encounter for screening for other disorder: Secondary | ICD-10-CM | POA: Diagnosis not present

## 2021-10-12 DIAGNOSIS — I10 Essential (primary) hypertension: Secondary | ICD-10-CM | POA: Diagnosis not present

## 2021-10-12 DIAGNOSIS — J439 Emphysema, unspecified: Secondary | ICD-10-CM | POA: Diagnosis not present

## 2021-11-17 ENCOUNTER — Ambulatory Visit
Admission: RE | Admit: 2021-11-17 | Discharge: 2021-11-17 | Disposition: A | Discharge status: Home / Self Care | Payer: Medicare Other | Source: Ambulatory Visit | Attending: Internal Medicine | Admitting: Internal Medicine

## 2021-11-17 DIAGNOSIS — M85851 Other specified disorders of bone density and structure, right thigh: Secondary | ICD-10-CM | POA: Diagnosis not present

## 2021-11-17 DIAGNOSIS — Z780 Asymptomatic menopausal state: Secondary | ICD-10-CM | POA: Diagnosis not present

## 2021-11-17 DIAGNOSIS — M8580 Other specified disorders of bone density and structure, unspecified site: Secondary | ICD-10-CM

## 2021-12-23 DIAGNOSIS — D123 Benign neoplasm of transverse colon: Secondary | ICD-10-CM | POA: Diagnosis not present

## 2021-12-23 DIAGNOSIS — K648 Other hemorrhoids: Secondary | ICD-10-CM | POA: Diagnosis not present

## 2021-12-23 DIAGNOSIS — K5730 Diverticulosis of large intestine without perforation or abscess without bleeding: Secondary | ICD-10-CM | POA: Diagnosis not present

## 2021-12-23 DIAGNOSIS — Z86010 Personal history of colonic polyps: Secondary | ICD-10-CM | POA: Diagnosis not present

## 2021-12-28 DIAGNOSIS — D123 Benign neoplasm of transverse colon: Secondary | ICD-10-CM | POA: Diagnosis not present

## 2022-01-11 ENCOUNTER — Ambulatory Visit: Payer: Medicare Other | Admitting: Dermatology

## 2022-01-11 DIAGNOSIS — L820 Inflamed seborrheic keratosis: Secondary | ICD-10-CM

## 2022-01-11 DIAGNOSIS — L821 Other seborrheic keratosis: Secondary | ICD-10-CM | POA: Diagnosis not present

## 2022-01-11 NOTE — Progress Notes (Signed)
? ?  Follow-Up Visit ?  ?Subjective  ?Jacqueline Barnett is a 76 y.o. female who presents for the following: New Patient (Initial Visit) (Patient here concerning moles/ tags at left side of face. ). ?The patient has spots, moles and lesions to be evaluated, some may be new or changing and the patient has concerns that these could be cancer. ? ?The following portions of the chart were reviewed this encounter and updated as appropriate:  Tobacco  Allergies  Meds  Problems  Med Hx  Surg Hx  Fam Hx   ?  ?Review of Systems: No other skin or systemic complaints except as noted in HPI or Assessment and Plan. ? ?Objective  ?Well appearing patient in no apparent distress; mood and affect are within normal limits. ? ?A focused examination was performed including head, including the scalp, face, neck, nose, ears, eyelids, and lips. Relevant physical exam findings are noted in the Assessment and Plan. ? ?left lower cheek and left temple x 15, left lower eyelid margin x 1 (16) ?Erythematous stuck-on, waxy papule or plaque ? ? ?Assessment & Plan  ?Inflamed seborrheic keratosis (16) ?left lower cheek and left temple x 15, left lower eyelid margin x 1 ?Irritated   -patient requests treatment ?Destruction of lesion - left lower cheek and left temple x 15, left lower eyelid margin x 1 ? ?Destruction method comment:  Electrodesiccation ?Anesthesia: the lesion was anesthetized in a standard fashion   ?Hemostasis achieved with:  pressure and electrodesiccation ?Outcome: patient tolerated procedure well with no complications   ? ?Seborrheic Keratoses -face ?- Stuck-on, waxy, tan-brown papules and/or plaques  ?- Benign-appearing ?- Discussed benign etiology and prognosis. ?- Observe ?- Call for any changes ? ?Return for 3 month follow up on isk at face . ?Garry Heater, CMA, am acting as scribe for [name], MD. ?Documentation: I have reviewed the above documentation for accuracy and completeness, and I agree with the  above. ? ?[name], MD ? ?

## 2022-01-11 NOTE — Patient Instructions (Addendum)
Seborrheic Keratosis ? ?What causes seborrheic keratoses? ?Seborrheic keratoses are harmless, common skin growths that first appear during adult life.  As time goes by, more growths appear.  Some people may develop a large number of them.  Seborrheic keratoses appear on both covered and uncovered body parts.  They are not caused by sunlight.  The tendency to develop seborrheic keratoses can be inherited.  They vary in color from skin-colored to gray, brown, or even black.  They can be either smooth or have a rough, warty surface.   ?Seborrheic keratoses are superficial and look as if they were stuck on the skin.  Under the microscope this type of keratosis looks like layers upon layers of skin.  That is why at times the top layer may seem to fall off, but the rest of the growth remains and re-grows.   ? ?Treatment ?Seborrheic keratoses do not need to be treated, but can easily be removed in the office.  Seborrheic keratoses often cause symptoms when they rub on clothing or jewelry.  Lesions can be in the way of shaving.  If they become inflamed, they can cause itching, soreness, or burning.  Removal of a seborrheic keratosis can be accomplished by freezing, burning, or surgery. ?If any spot bleeds, scabs, or grows rapidly, please return to have it checked, as these can be an indication of a skin cancer. ? ? ? ? ? ?If You Need Anything After Your Visit ? ?If you have any questions or concerns for your doctor, please call our main line at [phone] and press option 4 to reach your doctor's medical assistant. If no one answers, please leave a voicemail as directed and we will return your call as soon as possible. Messages left after 4 pm will be answered the following business day.  ? ?You may also send [place] a message via MyChart. We typically respond to MyChart messages within 1-2 business days. ? ?For prescription refills, please ask your pharmacy to contact our office. Our fax number is [phone]. ? ?If you have  an urgent issue when the clinic is closed that cannot wait until the next business day, you can page your doctor at the number below.   ? ?Please note that while we do our best to be available for urgent issues outside of office hours, we are not available 24/7.  ? ?If you have an urgent issue and are unable to reach [place], you may choose to seek medical care at your doctor's office, retail clinic, urgent care center, or emergency room. ? ?If you have a medical emergency, please immediately call 911 or go to the emergency department. ? ?Pager Numbers ? ?- Dr. Nehemiah Massed: [phone] ? ?- Dr. Laurence Ferrari: [phone] ? ?- Dr. Nicole Kindred: [phone] ? ?In the event of inclement weather, please call our main line at [phone] for an update on the status of any delays or closures. ? ?Dermatology Medication Tips: ?Please keep the boxes that topical medications come in in order to help keep track of the instructions about where and how to use these. Pharmacies typically print the medication instructions only on the boxes and not directly on the medication tubes.  ? ?If your medication is too expensive, please contact our office at [phone] option 4 or send [place] a message through [name].  ? ?We are unable to tell what your co-pay for medications will be in advance as this is different depending on your insurance coverage. However, we may be able to find a substitute medication  at lower cost or fill out paperwork to get insurance to cover a needed medication.  ? ?If a prior authorization is required to get your medication covered by your insurance company, please allow [place] 1-2 business days to complete this process. ? ?Drug prices often vary depending on where the prescription is filled and some pharmacies may offer cheaper prices. ? ?The website www.goodrx.com contains coupons for medications through different pharmacies. The prices here do not account for what the cost may be with help from insurance (it may be cheaper with  your insurance), but the website can give you the price if you did not use any insurance.  ?- You can print the associated coupon and take it with your prescription to the pharmacy.  ?- You may also stop by our office during regular business hours and pick up a GoodRx coupon card.  ?- If you need your prescription sent electronically to a different pharmacy, notify our office through [facility] or by phone at [phone] option 4. ? ? ? ? ?Si Usted Necesita Algo Despu?s de Su Visita ? ?Tambi?n puede enviarnos un mensaje a trav?s de MyChart. Por lo general respondemos a los mensajes de MyChart en el transcurso de 1 a 2 d?as h?biles. ? ?Para renovar recetas, por favor pida a su farmacia que se ponga en contacto con nuestra oficina. Nuestro n?mero de fax es el [phone]. ? ?Si tiene un asunto urgente cuando la cl?nica est? cerrada y que no puede esperar hasta el siguiente d?a h?bil, puede llamar/localizar a su doctor(a) al n?mero que aparece a continuaci?n.  ? ?Por favor, tenga en cuenta que aunque hacemos todo lo posible para estar disponibles para asuntos urgentes fuera del horario de oficina, no estamos disponibles las 24 horas del d?a, los 7 d?as de la semana.  ? ?Si tiene un problema urgente y no puede comunicarse con nosotros, puede optar por buscar atenci?n m?dica  en el consultorio de su doctor(a), en una cl?nica privada, en un centro de atenci?n urgente o en una sala de emergencias. ? ?Si tiene Engineer, maintenance (IT) m?dica, por favor llame inmediatamente al 911 o vaya a la sala de emergencias. ? ?N?meros de b?per ? ?- Dr. Nehemiah Massed: [phone] ? ?- Dra. Moye: [phone] ? ?- Dra. Nicole Kindred: [phone] ? ?En caso de inclemencias del tiempo, por favor llame a nuestra l?nea principal al [phone] para una actualizaci?n sobre el estado de cualquier retraso o cierre. ? ?Consejos para la medicaci?n en dermatolog?a: ?Por favor, guarde las cajas en las que vienen los medicamentos de uso t?pico para  ayudarle a seguir las instrucciones sobre d?nde y c?mo usarlos. Las farmacias generalmente imprimen las instrucciones del medicamento s?lo en las cajas y no directamente en los tubos del [place].  ? ?Si su medicamento es muy caro, por favor, p?ngase en contacto con Zigmund Daniel llamando al [phone] y presione la opci?n 4 o env?enos un mensaje a trav?s de MyChart.  ? ?No podemos decirle cu?l ser? su copago por los medicamentos por adelantado ya que esto es diferente dependiendo de la cobertura de su seguro. Sin embargo, es posible que podamos encontrar un medicamento sustituto a Electrical engineer un formulario para que el seguro cubra el medicamento que se considera necesario.  ? ?Si se requiere Ardelia Mems autorizaci?n previa para que su compa??a de seguros [place] su medicamento, por favor perm?tanos de 1 a 2 d?as h?biles para completar este proceso. ? ?Los precios de los medicamentos var?an con frecuencia dependiendo del Environmental consultant de d?nde  se surte la receta y alguna farmacias pueden ofrecer precios m?s baratos. ? ?El sitio web www.goodrx.com tiene cupones para medicamentos de Airline pilot. Los precios aqu? no tienen en cuenta lo que podr?a costar con la ayuda del seguro (puede ser m?s barato con su seguro), pero el sitio web puede darle el precio si no utiliz? ning?n seguro.  ?- Puede imprimir el cup?n correspondiente y llevarlo con su receta a la farmacia.  ?- Tambi?n puede pasar por nuestra oficina durante el horario de atenci?n regular y recoger una tarjeta de cupones de GoodRx.  ?- Si necesita que su receta se env?e electr?nicamente a Chiropodist, informe a nuestra oficina a trav?s de MyChart de [place] o por tel?fono llamando al [phone] y presione la opci?n 4. ? ?

## 2022-01-12 ENCOUNTER — Encounter: Payer: Self-pay | Admitting: Dermatology

## 2022-04-14 DIAGNOSIS — K5904 Chronic idiopathic constipation: Secondary | ICD-10-CM | POA: Diagnosis not present

## 2022-04-14 DIAGNOSIS — I5189 Other ill-defined heart diseases: Secondary | ICD-10-CM | POA: Diagnosis not present

## 2022-04-14 DIAGNOSIS — N1831 Chronic kidney disease, stage 3a: Secondary | ICD-10-CM | POA: Diagnosis not present

## 2022-04-14 DIAGNOSIS — E1122 Type 2 diabetes mellitus with diabetic chronic kidney disease: Secondary | ICD-10-CM | POA: Diagnosis not present

## 2022-04-14 DIAGNOSIS — M8580 Other specified disorders of bone density and structure, unspecified site: Secondary | ICD-10-CM | POA: Diagnosis not present

## 2022-04-14 DIAGNOSIS — E785 Hyperlipidemia, unspecified: Secondary | ICD-10-CM | POA: Diagnosis not present

## 2022-04-14 DIAGNOSIS — J439 Emphysema, unspecified: Secondary | ICD-10-CM | POA: Diagnosis not present

## 2022-04-14 DIAGNOSIS — I700 Atherosclerosis of aorta: Secondary | ICD-10-CM | POA: Diagnosis not present

## 2022-04-14 DIAGNOSIS — E042 Nontoxic multinodular goiter: Secondary | ICD-10-CM | POA: Diagnosis not present

## 2022-04-14 DIAGNOSIS — K219 Gastro-esophageal reflux disease without esophagitis: Secondary | ICD-10-CM | POA: Diagnosis not present

## 2022-04-14 DIAGNOSIS — I10 Essential (primary) hypertension: Secondary | ICD-10-CM | POA: Diagnosis not present

## 2022-04-19 ENCOUNTER — Ambulatory Visit: Payer: Medicare Other | Admitting: Dermatology

## 2022-05-31 DIAGNOSIS — E119 Type 2 diabetes mellitus without complications: Secondary | ICD-10-CM | POA: Diagnosis not present

## 2022-05-31 DIAGNOSIS — H43813 Vitreous degeneration, bilateral: Secondary | ICD-10-CM | POA: Diagnosis not present

## 2022-05-31 DIAGNOSIS — Z961 Presence of intraocular lens: Secondary | ICD-10-CM | POA: Diagnosis not present

## 2022-06-02 DIAGNOSIS — K219 Gastro-esophageal reflux disease without esophagitis: Secondary | ICD-10-CM | POA: Diagnosis not present

## 2022-06-02 DIAGNOSIS — N1831 Chronic kidney disease, stage 3a: Secondary | ICD-10-CM | POA: Diagnosis not present

## 2022-06-02 DIAGNOSIS — E119 Type 2 diabetes mellitus without complications: Secondary | ICD-10-CM | POA: Diagnosis not present

## 2022-06-02 DIAGNOSIS — I10 Essential (primary) hypertension: Secondary | ICD-10-CM | POA: Diagnosis not present

## 2022-06-02 DIAGNOSIS — E785 Hyperlipidemia, unspecified: Secondary | ICD-10-CM | POA: Diagnosis not present

## 2022-06-09 ENCOUNTER — Ambulatory Visit: Payer: Medicare Other | Admitting: Dermatology

## 2022-06-09 DIAGNOSIS — L820 Inflamed seborrheic keratosis: Secondary | ICD-10-CM | POA: Diagnosis not present

## 2022-06-09 DIAGNOSIS — L309 Dermatitis, unspecified: Secondary | ICD-10-CM

## 2022-06-09 DIAGNOSIS — L409 Psoriasis, unspecified: Secondary | ICD-10-CM

## 2022-06-09 MED ORDER — CLOBETASOL PROPIONATE 0.05 % EX CREA: 1.0000 | TOPICAL_CREAM | Freq: Two times a day (BID) | CUTANEOUS | 0 refills | Status: DC

## 2022-06-09 NOTE — Patient Instructions (Signed)
Due to recent changes in healthcare laws, you may see results of your pathology and/or laboratory studies on MyChart before the doctors have had a chance to review them. We understand that in some cases there may be results that are confusing or concerning to you. Please understand that not all results are received at the same time and often the doctors may need to interpret multiple results in order to provide you with the best plan of care or course of treatment. Therefore, we ask that you please give us 2 business days to thoroughly review all your results before contacting the office for clarification. Should we see a critical lab result, you will be contacted sooner.   If You Need Anything After Your Visit  If you have any questions or concerns for your doctor, please call our main line at [phone] and press option 4 to reach your doctor's medical assistant. If no one answers, please leave a voicemail as directed and we will return your call as soon as possible. Messages left after 4 pm will be answered the following business day.   You may also send us a message via MyChart. We typically respond to MyChart messages within 1-2 business days.  For prescription refills, please ask your pharmacy to contact our office. Our fax number is [phone].  If you have an urgent issue when the clinic is closed that cannot wait until the next business day, you can page your doctor at the number below.    Please note that while we do our best to be available for urgent issues outside of office hours, we are not available 24/7.   If you have an urgent issue and are unable to reach us, you may choose to seek medical care at your doctor's office, retail clinic, urgent care center, or emergency room.  If you have a medical emergency, please immediately call 911 or go to the emergency department.  Pager Numbers  - Dr. Kowalski: [phone]  - Dr. Moye: [phone]  - Dr. Stewart:  [phone]  In the event of inclement weather, please call our main line at [phone] for an update on the status of any delays or closures.  Dermatology Medication Tips: Please keep the boxes that topical medications come in in order to help keep track of the instructions about where and how to use these. Pharmacies typically print the medication instructions only on the boxes and not directly on the medication tubes.   If your medication is too expensive, please contact our office at [phone] option 4 or send us a message through MyChart.   We are unable to tell what your co-pay for medications will be in advance as this is different depending on your insurance coverage. However, we may be able to find a substitute medication at lower cost or fill out paperwork to get insurance to cover a needed medication.   If a prior authorization is required to get your medication covered by your insurance company, please allow us 1-2 business days to complete this process.  Drug prices often vary depending on where the prescription is filled and some pharmacies may offer cheaper prices.  The website www.goodrx.com contains coupons for medications through different pharmacies. The prices here do not account for what the cost may be with help from insurance (it may be cheaper with your insurance), but the website can give you the price if you did not use any insurance.  - You can print the associated coupon and take it with   your prescription to the pharmacy.  - You may also stop by our office during regular business hours and pick up a GoodRx coupon card.  - If you need your prescription sent electronically to a different pharmacy, notify our office through [place] MyChart or by phone at [phone] option 4.     Si Usted Necesita Algo Despus de Su Visita  Tambin puede enviarnos un mensaje a travs de MyChart. Por lo general respondemos a los mensajes de MyChart en el transcurso de 1 a 2  das hbiles.  Para renovar recetas, por favor pida a su farmacia que se ponga en contacto con nuestra oficina. Nuestro nmero de fax es el [phone].  Si tiene un asunto urgente cuando la clnica est cerrada y que no puede esperar hasta el siguiente da hbil, puede llamar/localizar a su doctor(a) al nmero que aparece a continuacin.   Por favor, tenga en cuenta que aunque hacemos todo lo posible para estar disponibles para asuntos urgentes fuera del horario de oficina, no estamos disponibles las 24 horas del da, los 7 das de la semana.   Si tiene un problema urgente y no puede comunicarse con nosotros, puede optar por buscar atencin mdica  en el consultorio de su doctor(a), en una clnica privada, en un centro de atencin urgente o en una sala de emergencias.  Si tiene una emergencia mdica, por favor llame inmediatamente al 911 o vaya a la sala de emergencias.  Nmeros de bper  - Dr. Kowalski: [phone]  - Dra. Moye: [phone]  - Dra. Stewart: [phone]  En caso de inclemencias del tiempo, por favor llame a nuestra lnea principal al [phone] para una actualizacin sobre el estado de cualquier retraso o cierre.  Consejos para la medicacin en dermatologa: Por favor, guarde las cajas en las que vienen los medicamentos de uso tpico para ayudarle a seguir las instrucciones sobre dnde y cmo usarlos. Las farmacias generalmente imprimen las instrucciones del medicamento slo en las cajas y no directamente en los tubos del medicamento.   Si su medicamento es muy caro, por favor, pngase en contacto con nuestra oficina llamando al [phone] y presione la opcin 4 o envenos un mensaje a travs de MyChart.   No podemos decirle cul ser su copago por los medicamentos por adelantado ya que esto es diferente dependiendo de la cobertura de su seguro. Sin embargo, es posible que podamos encontrar un medicamento sustituto a menor costo o llenar un formulario para que el  seguro cubra el medicamento que se considera necesario.   Si se requiere una autorizacin previa para que su compaa de seguros cubra su medicamento, por favor permtanos de 1 a 2 das hbiles para completar este proceso.  Los precios de los medicamentos varan con frecuencia dependiendo del lugar de dnde se surte la receta y alguna farmacias pueden ofrecer precios ms baratos.  El sitio web www.goodrx.com tiene cupones para medicamentos de diferentes farmacias. Los precios aqu no tienen en cuenta lo que podra costar con la ayuda del seguro (puede ser ms barato con su seguro), pero el sitio web puede darle el precio si no utiliz ningn seguro.  - Puede imprimir el cupn correspondiente y llevarlo con su receta a la farmacia.  - Tambin puede pasar por nuestra oficina durante el horario de atencin regular y recoger una tarjeta de cupones de GoodRx.  - Si necesita que su receta se enve electrnicamente a una farmacia diferente, informe a nuestra oficina a travs de MyChart de [place]   o por telfono llamando al [phone] y presione la opcin 4.  

## 2022-06-09 NOTE — Progress Notes (Signed)
   Follow-Up Visit   Subjective  Jacqueline Barnett is a 76 y.o. female who presents for the following: Irritated skin lesion (On the L cheek - patient would like removed today.) and Scaly rash (On the hands - patient was prescribed TMC 0.1% in the past but continues to break out on the palms of her hands).  The following portions of the chart were reviewed this encounter and updated as appropriate:   Tobacco  Allergies  Meds  Problems  Med Hx  Surg Hx  Fam Hx     Review of Systems:  No other skin or systemic complaints except as noted in HPI or Assessment and Plan.  Objective  Well appearing patient in no apparent distress; mood and affect are within normal limits.  A focused examination was performed including the face and hands. Relevant physical exam findings are noted in the Assessment and Plan.  L cheek x 1 Erythematous stuck-on, waxy papule or plaque 1.2 cm      B/L hand Scale of the hands.   Assessment & Plan  Inflamed seborrheic keratosis L cheek x 1 Symptomatic, irritating, patient would like treated. Destruction of lesion - L cheek x 1 Destruction method comment:  Electrodesiccation Informed consent: discussed and consent obtained   Hemostasis achieved with:  electrodesiccation Outcome: patient tolerated procedure well with no complications    Psoriasis B/L hand Vs atopic dermatitis/psoriasis overlap - Psoriasis is a chronic non-curable, but treatable genetic/hereditary disease that may have other systemic features affecting other organ systems such as joints (Psoriatic Arthritis). It is associated with an increased risk of inflammatory bowel disease, heart disease, non-alcoholic fatty liver disease, and depression.    Start Clobetasol cream to aa's hands QD-BID up to 5d/wk until improved then discontinue.   Sample given of [place] patient to apply to aa QD until sample finished (non-formulary on her insurance).  clobetasol cream (TEMOVATE) 0.05 % -  B/L hand Apply 1 Application topically 2 (two) times daily.  Return in about 4 months (around 10/09/2022) for ISK and psoriasis follow up.  [name], CMA, am acting as scribe for [name], MD . Documentation: I have reviewed the above documentation for accuracy and completeness, and I agree with the above.  [name], MD

## 2022-06-14 ENCOUNTER — Encounter: Payer: Self-pay | Admitting: Dermatology

## 2022-06-17 DIAGNOSIS — K5904 Chronic idiopathic constipation: Secondary | ICD-10-CM | POA: Diagnosis not present

## 2022-06-17 DIAGNOSIS — K219 Gastro-esophageal reflux disease without esophagitis: Secondary | ICD-10-CM | POA: Diagnosis not present

## 2022-06-17 DIAGNOSIS — D126 Benign neoplasm of colon, unspecified: Secondary | ICD-10-CM | POA: Diagnosis not present

## 2022-07-02 ENCOUNTER — Other Ambulatory Visit: Payer: Self-pay | Admitting: Internal Medicine

## 2022-07-02 DIAGNOSIS — Z1231 Encounter for screening mammogram for malignant neoplasm of breast: Secondary | ICD-10-CM

## 2022-07-21 ENCOUNTER — Ambulatory Visit: Payer: Medicare Other | Admitting: Dermatology

## 2022-08-04 ENCOUNTER — Ambulatory Visit: Payer: Medicare Other

## 2022-09-29 ENCOUNTER — Ambulatory Visit
Admission: RE | Admit: 2022-09-29 | Discharge: 2022-09-29 | Disposition: A | Discharge status: Home / Self Care | Payer: Medicare Other | Source: Ambulatory Visit | Attending: Internal Medicine | Admitting: Internal Medicine

## 2022-09-29 DIAGNOSIS — Z1231 Encounter for screening mammogram for malignant neoplasm of breast: Secondary | ICD-10-CM

## 2022-09-30 ENCOUNTER — Other Ambulatory Visit: Payer: Self-pay | Admitting: Internal Medicine

## 2022-09-30 DIAGNOSIS — R928 Other abnormal and inconclusive findings on diagnostic imaging of breast: Secondary | ICD-10-CM

## 2022-10-01 IMAGING — MG DIGITAL SCREENING BILAT W/ TOMO W/ CAD
8 series · 8 of 24 positions shown · non-contrast
Comparison: Previous exam(s).

CLINICAL DATA: Screening.

EXAM:
DIGITAL SCREENING BILATERAL MAMMOGRAM WITH TOMO AND CAD

[L CC synth-2D]
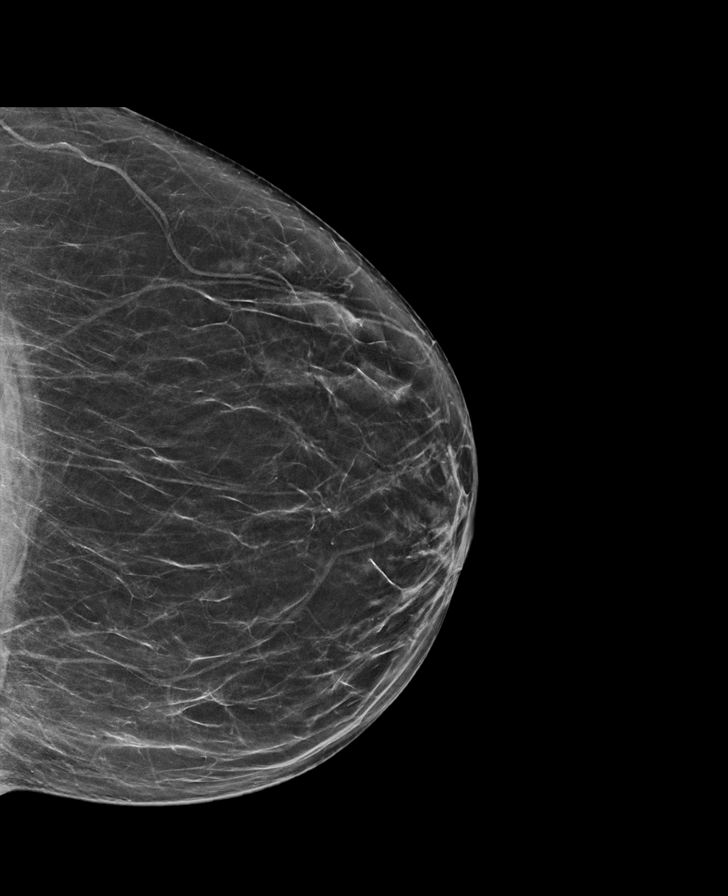

[R CC synth-2D]
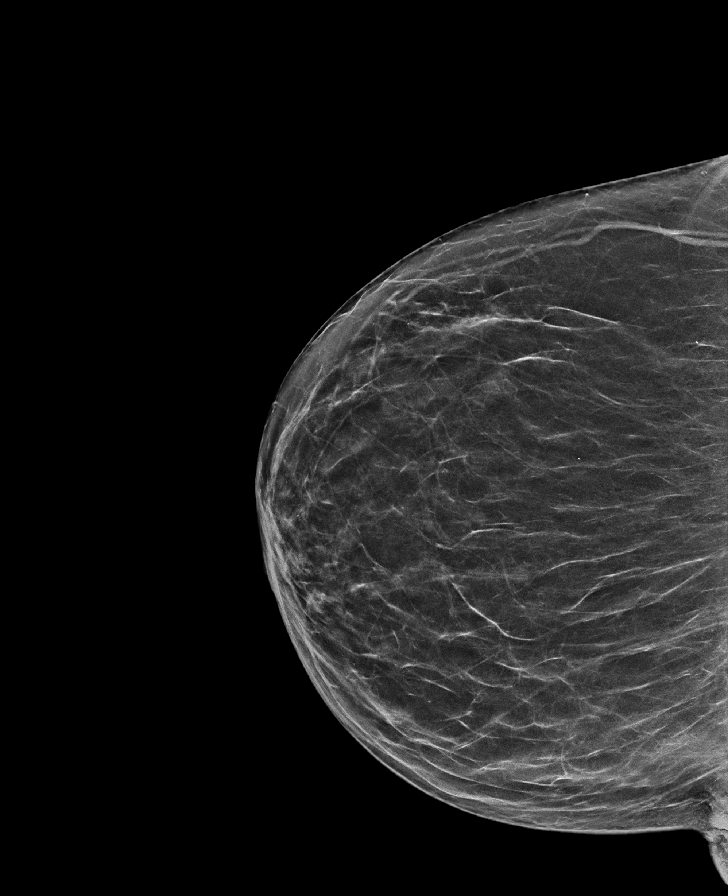

[L MLO synth-2D]
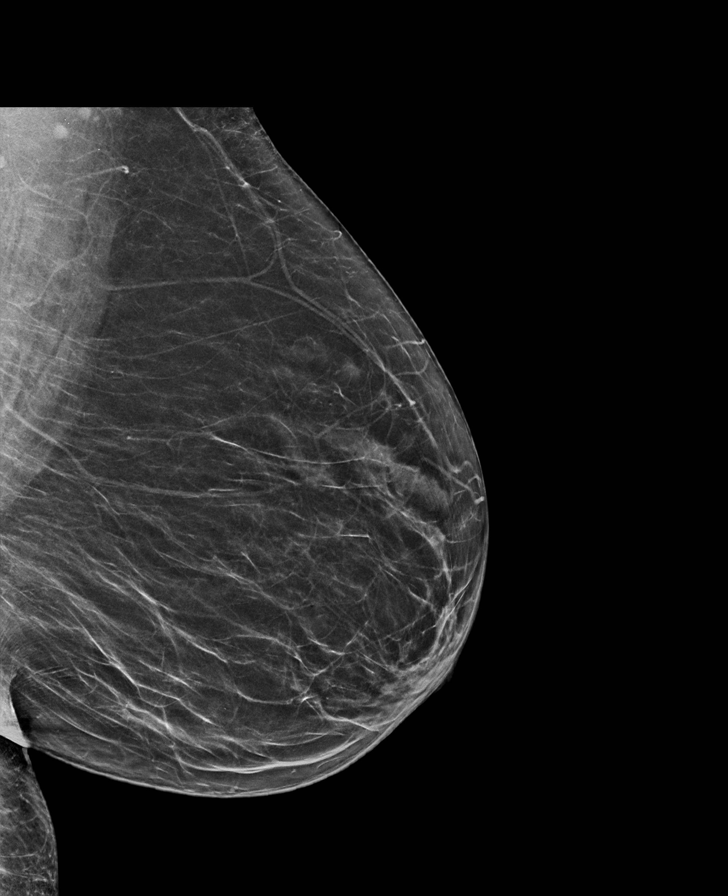

[R MLO synth-2D]
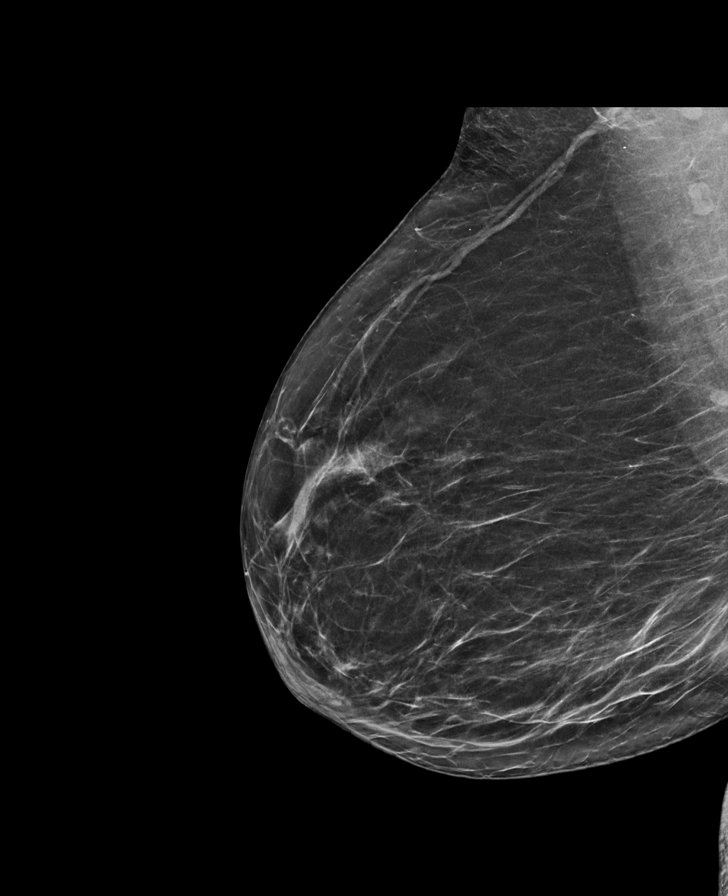

[L MLO tomo · tomo slice 33/65.0]
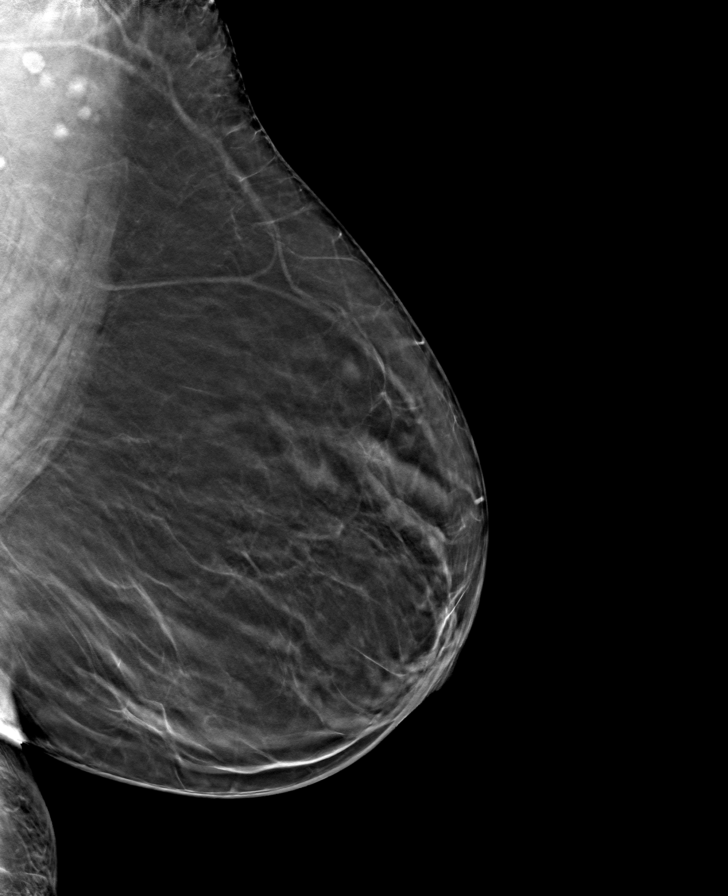

[R MLO tomo · tomo slice 35/69.0]
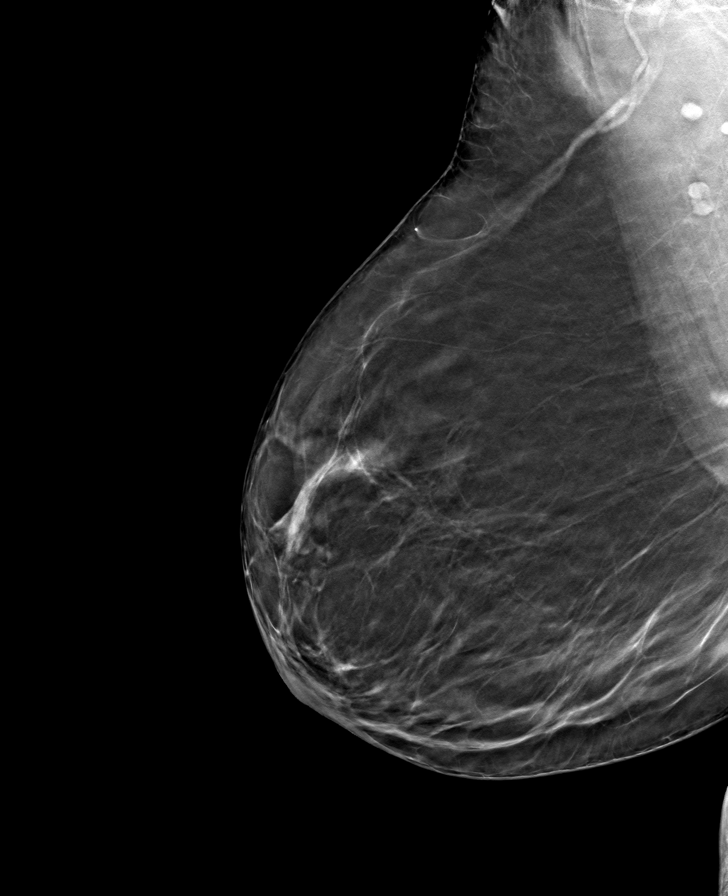

[L CC tomo · tomo slice 32/63.0]
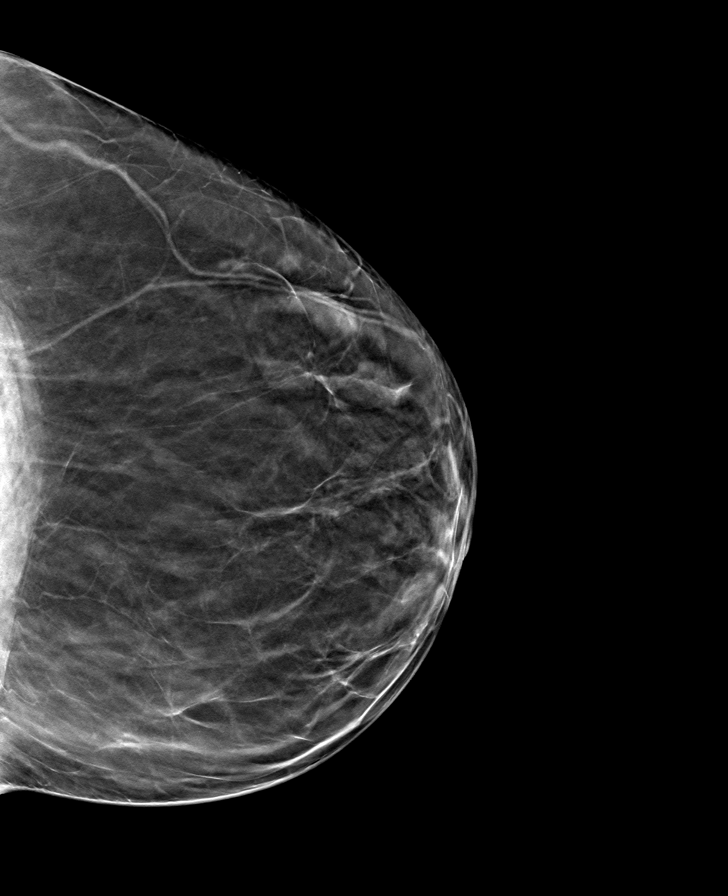

[R CC tomo · tomo slice 31/62.0]
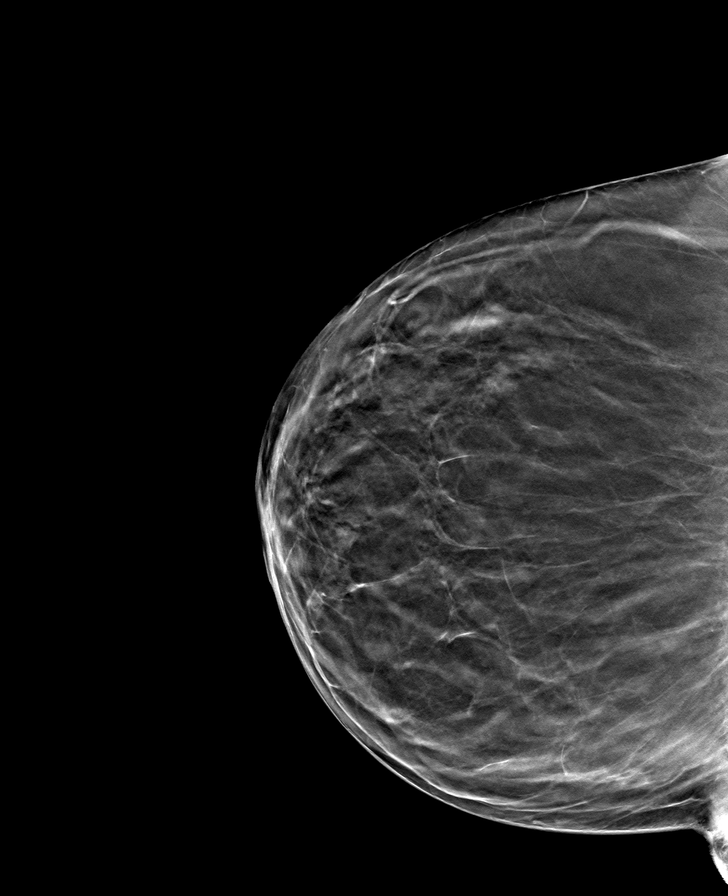

[8 of 24 positions shown; findings below may reference images not displayed]

ACR Breast Density Category b: There are scattered areas of
fibroglandular density.
FINDINGS: There are no findings suspicious for malignancy. Images were
processed with CAD.
IMPRESSION: No mammographic evidence of malignancy. A result letter of this
screening mammogram will be mailed directly to the patient.

RECOMMENDATION:
Screening mammogram in one year. (Code:[id number])

BI-RADS CATEGORY  1: Negative.

## 2022-10-18 ENCOUNTER — Ambulatory Visit
Admission: RE | Admit: 2022-10-18 | Discharge: 2022-10-18 | Disposition: A | Discharge status: Home / Self Care | Payer: Medicare Other | Source: Ambulatory Visit | Attending: Internal Medicine | Admitting: Internal Medicine

## 2022-10-18 DIAGNOSIS — R928 Other abnormal and inconclusive findings on diagnostic imaging of breast: Secondary | ICD-10-CM

## 2022-10-18 DIAGNOSIS — R921 Mammographic calcification found on diagnostic imaging of breast: Secondary | ICD-10-CM | POA: Diagnosis not present

## 2022-10-19 DIAGNOSIS — N1831 Chronic kidney disease, stage 3a: Secondary | ICD-10-CM | POA: Diagnosis not present

## 2022-10-19 DIAGNOSIS — E1122 Type 2 diabetes mellitus with diabetic chronic kidney disease: Secondary | ICD-10-CM | POA: Diagnosis not present

## 2022-10-19 DIAGNOSIS — J869 Pyothorax without fistula: Secondary | ICD-10-CM | POA: Diagnosis not present

## 2022-10-19 DIAGNOSIS — E785 Hyperlipidemia, unspecified: Secondary | ICD-10-CM | POA: Diagnosis not present

## 2022-10-19 DIAGNOSIS — I5189 Other ill-defined heart diseases: Secondary | ICD-10-CM | POA: Diagnosis not present

## 2022-10-19 DIAGNOSIS — I700 Atherosclerosis of aorta: Secondary | ICD-10-CM | POA: Diagnosis not present

## 2022-10-19 DIAGNOSIS — K5904 Chronic idiopathic constipation: Secondary | ICD-10-CM | POA: Diagnosis not present

## 2022-10-19 DIAGNOSIS — K219 Gastro-esophageal reflux disease without esophagitis: Secondary | ICD-10-CM | POA: Diagnosis not present

## 2022-10-19 DIAGNOSIS — Z0000 Encounter for general adult medical examination without abnormal findings: Secondary | ICD-10-CM | POA: Diagnosis not present

## 2022-10-19 DIAGNOSIS — Z23 Encounter for immunization: Secondary | ICD-10-CM | POA: Diagnosis not present

## 2022-10-19 DIAGNOSIS — E041 Nontoxic single thyroid nodule: Secondary | ICD-10-CM | POA: Diagnosis not present

## 2022-10-19 DIAGNOSIS — I10 Essential (primary) hypertension: Secondary | ICD-10-CM | POA: Diagnosis not present

## 2022-12-08 NOTE — Progress Notes (Deleted)
CARDIOLOGY OFFICE NOTE  Date:  12/08/2022    Jacqueline Barnett Date of Birth: 07/18/1946 Medical Record [id number]  PCP:  Wenda Low, MD  Cardiologist:  Johnsie Cancel    No chief complaint on file.    History of Present Illness: Jacqueline Barnett is a 77 y.o. female has a history of HTN, HLD, DM, COPD and prior tobacco abuse.   Presented to the hospital in December 2020  with acute respiratory failure - + for COVID 19 infection. EKG was abnormal. Prolonged QT. No known cardiac history. No delta troponin.  Echo was obtained - EF 55 to 60%. Cardiac MRI 10/19/19 with no myocarditis  Treated with Remdesivir.   She  stopped smoking 3 years ago.   Denies  Palpitations, syncope, dyspnea or chest pain Compliant with meds  Lives with one daughter and the other is next door  No chest pain   Active takes care of great grand daughter after school   Originally from [place] has not been back there since [place] with family there She does not drive   ***  Past Medical History:  Diagnosis Date   Diabetes mellitus without complication ([place])    Hypercholesteremia    Hypertension     Past Surgical History:  Procedure Laterality Date   ABDOMINAL HYSTERECTOMY     FOOT SURGERY       Medications: No outpatient medications have been marked as taking for the 12/17/22 encounter (Appointment) with Josue Hector, MD.     Allergies: No Known Allergies  Social History: The patient  reports that she has been smoking. She has never used smokeless tobacco. She reports that she does not drink alcohol.   Family History: The patient's family history includes Diabetes in an other family member. Mother died with ovarian cancer in her early 32's. Father was diabetic but lived to be 37.   Review of Systems: Please see the history of present illness.   All other systems are reviewed and negative.   Physical Exam: VS:  There were no vitals taken for this visit. Marland Kitchen  BMI There is no  height or weight on file to calculate BMI.  Wt Readings from Last 3 Encounters:  09/03/21 160 lb 9.6 oz (72.8 kg)  01/28/20 157 lb (71.2 kg)  11/05/19 158 lb (71.7 kg)   Affect appropriate Healthy:  appears stated age HEENT: normal Neck supple with no adenopathy JVP normal no bruits no thyromegaly Lungs clear with no wheezing and good diaphragmatic motion Heart:  S1/S2 no murmur, no rub, gallop or click PMI normal Abdomen: benighn, BS positve, no tenderness, no AAA no bruit.  No HSM or HJR Distal pulses intact with no bruits No edema Neuro non-focal Skin warm and dry No muscular weakness   LABORATORY DATA:  EKG:   SR rate 69 normal QT 406 msec    Lab Results  Component Value Date   WBC 8.2 09/23/2019   HGB 12.3 09/23/2019   HCT 36.9 09/23/2019   PLT 457 (H) 09/23/2019   GLUCOSE 232 (H) 09/23/2019   CHOL 138 09/20/2019   TRIG 88 09/20/2019   HDL 54 09/20/2019   LDLCALC 66 09/20/2019   ALT 14 09/23/2019   AST 18 09/23/2019   NA 137 09/23/2019   K 4.6 09/23/2019   CL 101 09/23/2019   CREATININE 1.07 (H) 09/23/2019   BUN 32 (H) 09/23/2019   CO2 24 09/23/2019   HGBA1C 8.2 (H) 09/18/2019     BNP (  last 3 results) No results for input(s): "BNP" in the last 8760 hours.  ProBNP (last 3 results) No results for input(s): "PROBNP" in the last 8760 hours.   Other Studies Reviewed Today:  CARDIAC MRI IMPRESSION 10/2019: 1. Normal biventricular chamber size and function. LVEF 55%, RVEF 53%.   2. No definite postcontrast delayed myocardial enhancement. T1 pre and post contrast values and ECV suggest no definite infiltrative process. No scar, infarction, fibrosis, or inflammation noted.     Electronically Signed   By: Cherlynn Kaiser   On: 10/25/2019 05:54  ECHO IMPRESSIONS 09/2019     1. Left ventricular ejection fraction, by visual estimation, is 55 to 60%. The left ventricle has normal function. There is no left ventricular hypertrophy.  2. Left  ventricular diastolic parameters are consistent with Grade I diastolic dysfunction (impaired relaxation).  3. The left ventricle has no regional wall motion abnormalities.  4. Global right ventricle has normal systolic function.The right ventricular size is normal. No increase in right ventricular wall thickness.  5. Left atrial size was normal.  6. Right atrial size was normal.  7. Presence of pericardial fat pad.  8. Trivial pericardial effusion is present.  9. Mild mitral annular calcification. 10. The mitral valve is degenerative. Trivial mitral valve regurgitation. 11. The tricuspid valve is grossly normal. Tricuspid valve regurgitation is trivial. 12. The aortic valve is tricuspid. Aortic valve regurgitation is not visualized. No evidence of aortic valve sclerosis or stenosis. 13. The pulmonic valve was normal in structure. Pulmonic valve regurgitation is not visualized. 14. Normal pulmonary artery systolic pressure. 15. The inferior vena cava is normal in size with greater than 50% respiratory variability, suggesting right atrial pressure of 3 mmHg. 16. No prior Echocardiogram.  Assessment/Plan:  1. Prior abnormal EKG in the setting of COVID 19 infection and multiple CV risk factors that incude HTN, HLD, DM2, age, tobacco abuse and aortic atherosclerosis on prior CT of the chest - her echo showed normal EF and no wall motion abnormalities and structurally reassuring. Cardiac MRI done 10/19/19 with EF 55% no myocarditis, normal post contrast images    2. 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 HTN - Well controlled.  Continue current medications and low sodium Dash type diet.    4. HLD - on statin therapy. Labs by PCP. LDL 66 labs 09/20/19   5. DM - Discussed low carb diet.  Target hemoglobin A1c is 6.5 or less.  Continue current medications.  6. Tobacco abuse - has not smoked in 3 years. Continued cessation encouraged.     Current medicines are reviewed with the patient today.  The patient does not have concerns regarding medicines other than what has been noted above.  The following changes have been made:  See above.  Labs/ tests ordered today include: None    No orders of the defined types were placed in this encounter.     Disposition:   FU with [place] in a year   Patient is agreeable to this plan and will call if any problems develop in the interim.   Signed: Jenkins Rouge, MD  12/08/2022 12:22 PM  [facility] [address] [place], [place]  [address] Phone: [phone] Fax: [phone]

## 2022-12-17 ENCOUNTER — Ambulatory Visit: Payer: Medicare Other | Admitting: Cardiovascular Disease

## 2022-12-28 DIAGNOSIS — R42 Dizziness and giddiness: Secondary | ICD-10-CM | POA: Diagnosis not present

## 2022-12-28 DIAGNOSIS — J439 Emphysema, unspecified: Secondary | ICD-10-CM | POA: Diagnosis not present

## 2022-12-28 DIAGNOSIS — E1122 Type 2 diabetes mellitus with diabetic chronic kidney disease: Secondary | ICD-10-CM | POA: Diagnosis not present

## 2023-02-03 NOTE — Progress Notes (Signed)
Office Visit    Patient Name: Jacqueline Barnett Date of Encounter: 02/04/2023  Primary Care Provider:  Georgann Housekeeper, MD Primary Cardiologist:  Charlton Haws, MD Primary Electrophysiologist: None   Past Medical History    Past Medical History:  Diagnosis Date   Diabetes mellitus without complication (HCC)    Hypercholesteremia    Hypertension    Past Surgical History:  Procedure Laterality Date   ABDOMINAL HYSTERECTOMY     FOOT SURGERY      Allergies  No Known Allergies   History of Present Illness    Jacqueline Barnett  is a 77 year old female with a PMH of HTN, HLD, DM type II, COPD, prior tobacco abuse, who presents today for overdue follow-up.  [name] was initially seen in 2020 in the ED after contracting COVID and was suffering progressive weakness and poor p.o. intake.  EKG shows sinus rhythm with PACs and diffuse deep TWI prolonged QT.  She was evaluated by cardiology due to abnormal EKG.  At bedtime troponins were mildly elevated with flat trend and 2D echo was completed showing EF of 55 to 60% with no RWMA and trivial pericardial effusion.  She was started on heparin drip but was later discontinued following echo results.  She was arranged for outpatient cardiac MRI to rule out possible myocarditis that showed normal biventricular chamber size and function with no delayed myocardial enhancement.  She was seen in follow-up on 11/05/2019 and EKG showed normal T wave and no QT prolongation.  She was last seen by Dr. Eden Emms on 09/03/2021 and reportedly doing well with no complaints of chest pain or palpitations.  Blood pressure was well-controlled and patient continue to abstain from tobacco abuse for greater than 2 years.  Since last being seen in the office patient reports she has been doing well with no new cardiac complaints.  She does endorse some dizziness that occurs in the mornings and throughout the day since being on Farxiga.  Her blood pressure today  was well-controlled at 124/62 and heart rate was 68 bpm.  She is staying active with her 69-year-old granddaughter and reports compliance with her current medications.  Her EKG was completed today with QT interval and patient does report an occasional nonsustained palpitations that resolved spontaneously.  She denies any fatigue or long COVID syndrome symptoms.  Patient denies chest pain, palpitations, dyspnea, PND, orthopnea, nausea, vomiting, dizziness, syncope, edema, weight gain, or early satiety.  Home Medications    Current Outpatient Medications  Medication Sig Dispense Refill   ACCU-CHEK SMARTVIEW test strip      albuterol (VENTOLIN HFA) 108 (90 Base) MCG/ACT inhaler Inhale 1 puff into the lungs every 6 (six) hours as needed for shortness of breath or wheezing.     Ascorbic Acid (VITAMIN C PO) Take by mouth.     aspirin 81 MG chewable tablet Chew by mouth daily.     clobetasol cream (TEMOVATE) 0.05 % Apply 1 Application topically 2 (two) times daily. 30 g 0   dapagliflozin propanediol (FARXIGA) 10 MG TABS tablet Take 10 mg by mouth daily.     famotidine (PEPCID) 20 MG tablet Take 20 mg by mouth 2 (two) times daily.     fluticasone (FLONASE) 50 MCG/ACT nasal spray Place 2 sprays into both nostrils daily. 16 g 2   hydrochlorothiazide (HYDRODIURIL) 25 MG tablet Take 25 mg by mouth daily.     LINZESS 145 MCG CAPS capsule Take 145 mcg by mouth daily.  losartan (COZAAR) 100 MG tablet Take 100 mg by mouth daily.     metFORMIN (GLUCOPHAGE) 500 MG tablet Take by mouth 2 (two) times daily with a meal.     Multiple Vitamins-Minerals (MULTIVITAMIN ADULTS 50+) TABS Take 1 tablet by mouth daily.     nystatin cream (MYCOSTATIN) as needed.     Omega-3 Fatty Acids (FISH OIL OMEGA-3 PO) Take by mouth.     Omeprazole (PRILOSEC PO) Take by mouth.     simvastatin (ZOCOR) 40 MG tablet Take 40 mg by mouth daily at 6 PM.      triamcinolone ointment (KENALOG) 0.5 % Apply 1 Application topically as needed.      VITAMIN D, CHOLECALCIFEROL, PO Take by mouth.     No current facility-administered medications for this visit.     Review of Systems  Please see the history of present illness.    (+) Dizziness (+) Palpitation  All other systems reviewed and are otherwise negative except as noted above.  Physical Exam    Wt Readings from Last 3 Encounters:  02/04/23 166 lb 3.2 oz (75.4 kg)  09/03/21 160 lb 9.6 oz (72.8 kg)  01/28/20 157 lb (71.2 kg)   VS: Vitals:   02/04/23 1125  BP: 124/62  Pulse: 68  SpO2: 96%  ,Body mass index is 29.44 kg/m.  Constitutional:      Appearance: Healthy appearance. Not in distress.  Neck:     Vascular: JVD normal.  Pulmonary:     Effort: Pulmonary effort is normal.     Breath sounds: No wheezing. No rales. Diminished in the bases Cardiovascular:     Normal rate. Regular rhythm. Normal S1. Normal S2.      Murmurs: There is no murmur.  Edema:    Peripheral edema absent.  Abdominal:     Palpations: Abdomen is soft non tender. There is no hepatomegaly.  Skin:    General: Skin is warm and dry.  Neurological:     General: No focal deficit present.     Mental Status: Alert and oriented to person, place and time.     Cranial Nerves: Cranial nerves are intact.  EKG/LABS/ Recent Cardiac Studies    ECG personally reviewed by me today -sinus rhythm with first-degree AVB with premature supraventricular complexes and rate of 68 bpm with no acute changes and QT interval of 450 ms  Cardiac Studies & Procedures       ECHOCARDIOGRAM  ECHOCARDIOGRAM COMPLETE 09/19/2019  Narrative ECHOCARDIOGRAM REPORT    Patient Name:   Jacqueline Barnett [facility] Date of Exam: 09/19/2019 Medical Rec #:  [id number]              Height:       63.0 in Accession #:    [id number]             Weight:       160.0 lb Date of Birth:  1945-10-15               BSA:          1.76 m Patient Age:    73 years               BP:           111/74 mmHg Patient Gender: F                       HR:           70 bpm. Exam Location:  Inpatient  Procedure: 2D Echo  MODIFIED REPORT: This report was modified by Lennie Odor MD on 09/19/2019 due to error. Indications:     Abnormal ECG R94.31  History:         Patient has no prior history of Echocardiogram examinations. Covid-19 Positive; Risk Factors:Hypertension and Diabetes.  Sonographer:     Thurman Coyer RDCS (AE) Referring Phys:  Kenn File DOUTOVA Diagnosing Phys: Lennie Odor MD  IMPRESSIONS   1. Left ventricular ejection fraction, by visual estimation, is 55 to 60%. The left ventricle has normal function. There is no left ventricular hypertrophy. 2. Left ventricular diastolic parameters are consistent with Grade I diastolic dysfunction (impaired relaxation). 3. The left ventricle has no regional wall motion abnormalities. 4. Global right ventricle has normal systolic function.The right ventricular size is normal. No increase in right ventricular wall thickness. 5. Left atrial size was normal. 6. Right atrial size was normal. 7. Presence of pericardial fat pad. 8. Trivial pericardial effusion is present. 9. Mild mitral annular calcification. 10. The mitral valve is degenerative. Trivial mitral valve regurgitation. 11. The tricuspid valve is grossly normal. Tricuspid valve regurgitation is trivial. 12. The aortic valve is tricuspid. Aortic valve regurgitation is not visualized. No evidence of aortic valve sclerosis or stenosis. 13. The pulmonic valve was normal in structure. Pulmonic valve regurgitation is not visualized. 14. Normal pulmonary artery systolic pressure. 15. The inferior vena cava is normal in size with greater than 50% respiratory variability, suggesting right atrial pressure of 3 mmHg. 16. No prior Echocardiogram.  FINDINGS Left Ventricle: Left ventricular ejection fraction, by visual estimation, is 55 to 60%. The left ventricle has normal function. The left ventricle has no regional wall  motion abnormalities. The left ventricular internal cavity size was the left ventricle is normal in size. There is no left ventricular hypertrophy. Left ventricular diastolic parameters are consistent with Grade I diastolic dysfunction (impaired relaxation). Normal left atrial pressure.  Right Ventricle: The right ventricular size is normal. No increase in right ventricular wall thickness. Global RV systolic function is has normal systolic function. The tricuspid regurgitant velocity is 1.96 m/s, and with an assumed right atrial pressure of 3 mmHg, the estimated right ventricular systolic pressure is normal at 18.4 mmHg.  Left Atrium: Left atrial size was normal in size.  Right Atrium: Right atrial size was normal in size  Pericardium: Trivial pericardial effusion is present. Presence of pericardial fat pad.  Mitral Valve: The mitral valve is degenerative in appearance. Mild mitral annular calcification. Trivial mitral valve regurgitation.  Tricuspid Valve: The tricuspid valve is grossly normal. Tricuspid valve regurgitation is trivial.  Aortic Valve: The aortic valve is tricuspid. Aortic valve regurgitation is not visualized. The aortic valve is structurally normal, with no evidence of sclerosis or stenosis.  Pulmonic Valve: The pulmonic valve was normal in structure. Pulmonic valve regurgitation is not visualized. Pulmonic regurgitation is not visualized.  Aorta: The aortic root is normal in size and structure.  Venous: The inferior vena cava is normal in size with greater than 50% respiratory variability, suggesting right atrial pressure of 3 mmHg.  IAS/Shunts: No atrial level shunt detected by color flow Doppler.   LEFT VENTRICLE PLAX 2D LVIDd:         4.09 cm  Diastology LVIDs:         2.57 cm  LV e' lateral:   5.66 cm/s LV PW:         0.94 cm  LV E/e' lateral: 13.2 LV IVS:  0.78 cm  LV e' medial:    6.20 cm/s LVOT diam:     2.20 cm  LV E/e' medial:  12.0 LV SV:          50 ml LV SV Index:   27.45 LVOT Area:     3.80 cm   RIGHT VENTRICLE RV S prime:     11.50 cm/s TAPSE (M-mode): 2.0 cm  LEFT ATRIUM             Index       RIGHT ATRIUM          Index LA diam:        3.70 cm 2.10 cm/m  RA Area:     9.51 cm LA Vol (A2C):   38.3 ml 21.78 ml/m RA Volume:   16.10 ml 9.15 ml/m LA Vol (A4C):   43.2 ml 24.56 ml/m LA Biplane Vol: 40.9 ml 23.26 ml/m  AORTA Ao Root diam: 2.90 cm  MITRAL VALVE                        TRICUSPID VALVE MV Area (PHT): 2.73 cm             TR Peak grad:   15.4 mmHg MV PHT:        80.62 msec           TR Vmax:        196.00 cm/s MV Decel Time: 278 msec MV E velocity: 74.60 cm/s 103 cm/s  SHUNTS MV A velocity: 98.10 cm/s 70.3 cm/s Systemic Diam: 2.20 cm MV E/A ratio:  0.76       1.5   Lennie Odor MD Electronically signed by Lennie Odor MD Signature Date/Time: 09/19/2019/4:03:51 PM    Final (Updated)      CARDIAC MRI  MR CARDIAC MORPHOLOGY W WO CONTRAST 10/25/2019  Narrative CLINICAL DATA:  Query myocarditis, recent covid-19 infection  EXAM: CARDIAC MRI  TECHNIQUE: The patient was scanned on a 1.5 Tesla GE magnet. A dedicated cardiac coil was used. Functional imaging was done using Fiesta sequences. 2,3, and 4 chamber views were done to assess for RWMA's. Modified Simpson's rule using a short axis stack was used to calculate an ejection fraction on a dedicated work [employer]. The patient received 7mL GADAVIST GADOBUTROL 1 MMOL/ML IV SOLN. After 10 minutes inversion recovery sequences were used to assess for infiltration and scar tissue.  CONTRAST:  7mL GADAVIST GADOBUTROL 1 MMOL/ML IV SOLN  FINDINGS: LEFT VENTRICLE:  Normal left ventricular size, thickness and systolic function (LVEF = 55%). There are no regional wall motion abnormalities.  There is no late gadolinium enhancement in the left ventricular myocardium.  Normal T1 myocardial nulling kinetics.  T1 precontrast  myocardium 1026 ms  T1 precontrast blood pool 1862 ms  T1 postcontrast myocardium 430 ms  T1 postcontrast blood pool 255 ms  ECV = 25%  RIGHT VENTRICLE:  Normal right ventricular size, thickness and systolic function (RVEF = 53%). There are no regional wall motion abnormalities.  ATRIA:  Normal left and right atrial size.  VALVES:  Assessment of valve function limited by motion artifact.  PERICARDIUM:  Normal pericardium.  No pericardial effusion.  OTHER:  MEASUREMENTS:  Volumetric assessment impacted by cardiac motion artifact.  LVEDV: 105 mL  LVESV: 47 mL  SV: 58 mL  CO: 4.7 L/min  Myocardial mass: 89 g  RVEDV: 104 mL  RVEDS: 48 mL  RVSV: 55 mL  IMPRESSION: 1.  Normal biventricular chamber size and function. LVEF 55%, RVEF 53%.  2. No definite postcontrast delayed myocardial enhancement. T1 pre and post contrast values and ECV suggest no definite infiltrative process. No scar, infarction, fibrosis, or inflammation noted.   Electronically Signed By: Weston Brass On: 10/25/2019 05:54         Lab Results  Component Value Date   WBC 8.2 09/23/2019   HGB 12.3 09/23/2019   HCT 36.9 09/23/2019   MCV 87.2 09/23/2019   PLT 457 (H) 09/23/2019   Lab Results  Component Value Date   CREATININE 1.07 (H) 09/23/2019   BUN 32 (H) 09/23/2019   NA 137 09/23/2019   K 4.6 09/23/2019   CL 101 09/23/2019   CO2 24 09/23/2019   Lab Results  Component Value Date   ALT 14 09/23/2019   AST 18 09/23/2019   ALKPHOS 60 09/23/2019   BILITOT 0.4 09/23/2019   Lab Results  Component Value Date   CHOL 138 09/20/2019   HDL 54 09/20/2019   LDLCALC 66 09/20/2019   TRIG 88 09/20/2019   CHOLHDL 2.6 09/20/2019    Lab Results  Component Value Date   HGBA1C 8.2 (H) 09/18/2019     Assessment & Plan    1.  Essential hypertension: -Patient's blood pressure today was well-controlled at 124/62 -She reports some dizziness that has been occurring since  starting Farxiga -We will decrease her losartan to 50 mg to see if her dizziness improves. -She was advised to check her blood pressures and contact the office with findings in 1 week.  2.  Prior prolonged QT: -Patient had previous prolonged QT related to COVID infection -Today patient's QT is normal at 450 ms  3.  DM type II: -Patient's last hemoglobin A1c was 7.4 -She is currently on Farxiga  4.  Hyperlipidemia: -Patient's last LDL cholesterol was 55 at goal -Continue Zocor 40 mg daily  Disposition: Follow-up with Charlton Haws, MD or APP in 3 months   Medication Adjustments/Labs and Tests Ordered: Current medicines are reviewed at length with the patient today.  Concerns regarding medicines are outlined above.   Signed, Napoleon Form, Leodis Rains, NP 02/04/2023, 11:53 AM [facility]

## 2023-02-04 ENCOUNTER — Ambulatory Visit: Payer: Medicare Other | Attending: Cardiovascular Disease | Admitting: Nurse Practitioner

## 2023-02-04 ENCOUNTER — Encounter: Payer: Self-pay | Admitting: Nurse Practitioner

## 2023-02-04 VITALS — BP 124/62 | HR 68 | Ht 63.0 in | Wt 166.2 lb

## 2023-02-04 DIAGNOSIS — R9431 Abnormal electrocardiogram [ECG] [EKG]: Secondary | ICD-10-CM

## 2023-02-04 DIAGNOSIS — E1165 Type 2 diabetes mellitus with hyperglycemia: Secondary | ICD-10-CM

## 2023-02-04 DIAGNOSIS — I10 Essential (primary) hypertension: Secondary | ICD-10-CM | POA: Diagnosis not present

## 2023-02-04 MED ORDER — BLOOD PRESSURE CUFF MISC
1.0000 | Freq: Every day | 0 refills | Status: AC
Start: 2023-02-04 — End: ?

## 2023-02-04 MED ORDER — LOSARTAN POTASSIUM 50 MG PO TABS: 50.0000 mg | ORAL_TABLET | Freq: Every day | ORAL | 3 refills | Status: DC

## 2023-02-04 NOTE — Patient Instructions (Signed)
Medication Instructions:  DECREASE Losartan to 50mg  Take 1 tablet once a day  *If you need a refill on your cardiac medications before your next appointment, please call your pharmacy*   Lab Work: None ordered   Testing/Procedures: None ordered   Follow-Up: At [facility], you and your health needs are our priority.  As part of our continuing mission to provide you with exceptional heart care, we have created designated Provider Care Teams.  These Care Teams include your primary Cardiologist (physician) and Advanced Practice Providers (APPs -  Physician Assistants and Nurse Practitioners) who all work together to provide you with the care you need, when you need it.  We recommend signing up for the patient portal called "MyChart".  Sign up information is provided on this After Visit Summary.  MyChart is used to connect with patients for Virtual Visits (Telemedicine).  Patients are able to view lab/test results, encounter notes, upcoming appointments, etc.  Non-urgent messages can be sent to your provider as well.   To learn more about what you can do with MyChart, go to [email or web address].    Your next appointment:   3 month(s)  Provider:   Robin Searing, NP       Other Instructions

## 2023-03-22 DIAGNOSIS — E1165 Type 2 diabetes mellitus with hyperglycemia: Secondary | ICD-10-CM | POA: Diagnosis not present

## 2023-03-22 DIAGNOSIS — I10 Essential (primary) hypertension: Secondary | ICD-10-CM | POA: Diagnosis not present

## 2023-04-23 DIAGNOSIS — I10 Essential (primary) hypertension: Secondary | ICD-10-CM | POA: Diagnosis not present

## 2023-04-23 DIAGNOSIS — E1165 Type 2 diabetes mellitus with hyperglycemia: Secondary | ICD-10-CM | POA: Diagnosis not present

## 2023-04-26 DIAGNOSIS — I10 Essential (primary) hypertension: Secondary | ICD-10-CM | POA: Diagnosis not present

## 2023-04-26 DIAGNOSIS — E1165 Type 2 diabetes mellitus with hyperglycemia: Secondary | ICD-10-CM | POA: Diagnosis not present

## 2023-05-04 ENCOUNTER — Other Ambulatory Visit: Payer: Self-pay | Admitting: Nurse Practitioner

## 2023-05-09 NOTE — Progress Notes (Addendum)
Office Visit    Patient Name: Jacqueline Barnett Date of Encounter: 05/10/2023  Primary Care Provider:  Georgann Housekeeper, MD Primary Cardiologist:  Charlton Haws, MD Primary Electrophysiologist: None   Past Medical History    Past Medical History:  Diagnosis Date   Diabetes mellitus without complication (HCC)    Hypercholesteremia    Hypertension    Past Surgical History:  Procedure Laterality Date   ABDOMINAL HYSTERECTOMY     FOOT SURGERY      Allergies  No Known Allergies   History of Present Illness    Jacqueline Barnett  is a 77 year old female with a PMH of HTN, HLD, DM type II, COPD, prior tobacco abuse, who presents today for overdue follow-up.   Ms. Prosen was initially seen in 2020 in the ED after contracting COVID and was suffering progressive weakness and poor p.o. intake.  EKG shows sinus rhythm with PACs and diffuse deep TWI prolonged QT.  She was evaluated by cardiology due to abnormal EKG.  Troponins were mildly elevated with flat trend and 2D echo was completed showing EF of 55 to 60% with no RWMA and trivial pericardial effusion.   She underwent cardiac MRI to rule out possible myocarditis that showed normal biventricular chamber size and function with no delayed myocardial enhancement.  She was seen in follow-up on 11/05/2019 and EKG showed normal T wave and no QT prolongation.  She was last seen by Dr. Eden Emms on 09/03/2021 and reportedly doing well with no complaints.  She was seen last on 02/04/2023 and was doing well with controlled blood pressures.  She endorsed some dizziness throughout the day since being on Farxiga.  EKG was completed showing normal QT interval.  Losartan was decreased due to complaint of dizziness to 50 mg daily  Since last being seen in the office patient reports she has been doing well but does report episodes of shortness of breath and what she describes as dizziness and swimmy headedness.  Her blood pressure today is slightly  elevated at 138/78 and heart rate is 60 bpm.  She reports compliance with her current medications and denies any adverse reactions.  She does note indiscretions with salt and has been eating pork skins more regularly.  She endorses not getting enough water but is drinking other substances such as tea.  She is also having difficulty with sleeping reports being overwhelmed lately with multiple family events and traveling.  During today's visit we discussed the importance abstaining from excess salt and also increasing her fluid intake to at least 64 ounces of water daily.  Patient denies chest pain, palpitations, dyspnea, PND, orthopnea, nausea, vomiting, dizziness, syncope, edema, weight gain, or early satiety.  Home Medications    Current Outpatient Medications  Medication Sig Dispense Refill   ACCU-CHEK SMARTVIEW test strip      albuterol (VENTOLIN HFA) 108 (90 Base) MCG/ACT inhaler Inhale 1 puff into the lungs every 6 (six) hours as needed for shortness of breath or wheezing.     Ascorbic Acid (VITAMIN C PO) Take 1 tablet by mouth daily.     aspirin 81 MG chewable tablet Chew by mouth daily.     Blood Pressure Monitoring (BLOOD PRESSURE CUFF) MISC 1 each by Does not apply route daily. 1 each 0   BREO ELLIPTA 100-25 MCG/ACT AEPB Inhale 1 puff into the lungs daily.     clobetasol cream (TEMOVATE) 0.05 % Apply 1 Application topically 2 (two) times daily. 30 g 0  dapagliflozin propanediol (FARXIGA) 10 MG TABS tablet Take 10 mg by mouth daily.     famotidine (PEPCID) 20 MG tablet Take 20 mg by mouth 2 (two) times daily.     fluticasone (FLONASE) 50 MCG/ACT nasal spray Place 2 sprays into both nostrils daily. 16 g 2   hydrochlorothiazide (HYDRODIURIL) 25 MG tablet Take 25 mg by mouth daily.     LINZESS 145 MCG CAPS capsule Take 145 mcg by mouth daily.     losartan (COZAAR) 50 MG tablet TAKE 1 TABLET(50 MG) BY MOUTH DAILY 90 tablet 2   metFORMIN (GLUCOPHAGE) 500 MG tablet Take by mouth 2 (two) times  daily with a meal.     Multiple Vitamins-Minerals (MULTIVITAMIN ADULTS 50+) TABS Take 1 tablet by mouth daily.     nystatin cream (MYCOSTATIN) as needed.     Omega-3 Fatty Acids (FISH OIL OMEGA-3 PO) Take 1 capsule by mouth daily.     simvastatin (ZOCOR) 40 MG tablet Take 40 mg by mouth daily at 6 PM.      triamcinolone ointment (KENALOG) 0.5 % Apply 1 Application topically as needed.     VITAMIN D, CHOLECALCIFEROL, PO Take 1 tablet by mouth daily.     No current facility-administered medications for this visit.     Review of Systems  Please see the history of present illness.    (+) Dizziness and swimmy headedness (+) Fatigue and insomnia  All other systems reviewed and are otherwise negative except as noted above.  Physical Exam    Wt Readings from Last 3 Encounters:  05/10/23 165 lb (74.8 kg)  02/04/23 166 lb 3.2 oz (75.4 kg)  09/03/21 160 lb 9.6 oz (72.8 kg)   VS: Vitals:   05/10/23 1134  BP: 138/78  Pulse: 68  SpO2: 96%  ,Body mass index is 29.23 kg/m.  Constitutional:      Appearance: Healthy appearance. Not in distress.  Neck:     Vascular: JVD normal.  Pulmonary:     Effort: Pulmonary effort is normal.     Breath sounds: No wheezing. No rales. Diminished in the bases Cardiovascular:     Normal rate. Regular rhythm. Normal S1. Normal S2.      Murmurs: There is no murmur.  Edema:    Peripheral edema absent.  Abdominal:     Palpations: Abdomen is soft non tender. There is no hepatomegaly.  Skin:    General: Skin is warm and dry.  Neurological:     General: No focal deficit present.     Mental Status: Alert and oriented to person, place and time.     Cranial Nerves: Cranial nerves are intact.  EKG/LABS/ Recent Cardiac Studies    ECG personally reviewed by me today -none completed today  Lab Results  Component Value Date   WBC 8.2 09/23/2019   HGB 12.3 09/23/2019   HCT 36.9 09/23/2019   MCV 87.2 09/23/2019   PLT 457 (H) 09/23/2019   Lab Results   Component Value Date   CREATININE 1.07 (H) 09/23/2019   BUN 32 (H) 09/23/2019   NA 137 09/23/2019   K 4.6 09/23/2019   CL 101 09/23/2019   CO2 24 09/23/2019   Lab Results  Component Value Date   ALT 14 09/23/2019   AST 18 09/23/2019   ALKPHOS 60 09/23/2019   BILITOT 0.4 09/23/2019   Lab Results  Component Value Date   CHOL 138 09/20/2019   HDL 54 09/20/2019   LDLCALC 66 09/20/2019   TRIG  88 09/20/2019   CHOLHDL 2.6 09/20/2019    Lab Results  Component Value Date   HGBA1C 8.2 (H) 09/18/2019     Assessment & Plan    1.  Essential hypertension: -Patient's blood pressure today was stable at 138/78 -She reports indiscretions with salt but has been compliant with current medication regimen. -Continue losartan 50 mg daily and HCTZ 25 mg daily  2.  Presyncope: -Patient reports that she has been having episodes of presyncope and swimmy headedness. -She denies any palpitations or tachycardia. -We will have her wear a 14-day ZIO monitor to evaluate for possible arrhythmia. -We will check CMET, magnesium, TSH, and CBC today -She was advised to increase her fluid intake to at least 64 ounces of fluid daily.  3.  DM type II: -Patient's last hemoglobin A1c was 7.4 -She is currently on Farxiga   4.  Hyperlipidemia: -Patient's last LDL cholesterol was 55 at goal -Continue Zocor 40 mg daily  5. Right carotid bruit: -Patient has greater than 50-year smoking history but is not a current smoker. -Right carotid bruit auscultated on exam today.  Disposition: Follow-up with Charlton Haws, MD or APP in 2 months    Medication Adjustments/Labs and Tests Ordered: Current medicines are reviewed at length with the patient today.  Concerns regarding medicines are outlined above.   Signed, Napoleon Form, Leodis Rains, NP 05/10/2023, 12:08 PM [facility]

## 2023-05-10 ENCOUNTER — Encounter: Payer: Self-pay | Admitting: Nurse Practitioner

## 2023-05-10 ENCOUNTER — Ambulatory Visit: Payer: Medicare Other | Attending: Nurse Practitioner | Admitting: Nurse Practitioner

## 2023-05-10 ENCOUNTER — Ambulatory Visit (INDEPENDENT_AMBULATORY_CARE_PROVIDER_SITE_OTHER): Payer: Medicare Other

## 2023-05-10 VITALS — BP 138/78 | HR 68 | Ht 63.0 in | Wt 165.0 lb

## 2023-05-10 DIAGNOSIS — E1165 Type 2 diabetes mellitus with hyperglycemia: Secondary | ICD-10-CM | POA: Diagnosis not present

## 2023-05-10 DIAGNOSIS — E785 Hyperlipidemia, unspecified: Secondary | ICD-10-CM

## 2023-05-10 DIAGNOSIS — I471 Supraventricular tachycardia, unspecified: Secondary | ICD-10-CM

## 2023-05-10 DIAGNOSIS — Z7984 Long term (current) use of oral hypoglycemic drugs: Secondary | ICD-10-CM

## 2023-05-10 DIAGNOSIS — R0989 Other specified symptoms and signs involving the circulatory and respiratory systems: Secondary | ICD-10-CM

## 2023-05-10 DIAGNOSIS — I10 Essential (primary) hypertension: Secondary | ICD-10-CM

## 2023-05-10 DIAGNOSIS — R9431 Abnormal electrocardiogram [ECG] [EKG]: Secondary | ICD-10-CM | POA: Diagnosis not present

## 2023-05-10 NOTE — Progress Notes (Unsigned)
Applied a 7 day Zio XT monitor to patient in the office  Jacqueline Barnett to read

## 2023-05-10 NOTE — Patient Instructions (Addendum)
Medication Instructions:  Your physician recommends that you continue on your current medications as directed. Please refer to the Current Medication list given to you today. *If you need a refill on your cardiac medications before your next appointment, please call your pharmacy*   Lab Work: TODAY-MAG, CBC, TSH, CMET If you have labs (blood work) drawn today and your tests are completely normal, you will receive your results only by: MyChart Message (if you have MyChart) OR A paper copy in the mail If you have any lab test that is abnormal or we need to change your treatment, we will call you to review the results.   Testing/Procedures: Your physician has requested that you have a carotid duplex. This test is an ultrasound of the carotid arteries in your neck. It looks at blood flow through these arteries that supply the brain with blood. Allow one hour for this exam. There are no restrictions or special instructions.  ZIO XT- Long Term Monitor Instructions  Your physician has requested you wear a ZIO patch monitor for 14 days.  This is a single patch monitor. [employer] supplies one patch monitor per enrollment. Additional stickers are not available. Please do not apply patch if you will be having a Nuclear Stress Test,  Echocardiogram, Cardiac CT, MRI, or Chest Xray during the period you would be wearing the  monitor. The patch cannot be worn during these tests. You cannot remove and re-apply the  ZIO XT patch monitor.  Your ZIO patch monitor will be mailed 3 day USPS to your address on file. It may take 3-5 days  to receive your monitor after you have been enrolled.  Once you have received your monitor, please review the enclosed instructions. Your monitor  has already been registered assigning a specific monitor serial # to you.  Billing and Patient Assistance Program Information  We have supplied [employer] with any of your insurance information on file for billing purposes. [employer]  offers a sliding scale Patient Assistance Program for patients that do not have  insurance, or whose insurance does not completely cover the cost of the ZIO monitor.  You must apply for the Patient Assistance Program to qualify for this discounted rate.  To apply, please call [employer] at [phone], select option 4, select option 2, ask to apply for  Patient Assistance Program. Meredeth Ide will ask your household income, and how many people  are in your household. They will quote your out-of-pocket cost based on that information.  [employer] will also be able to set up a 55-month, interest-free payment plan if needed.  Applying the monitor   Shave hair from upper left chest.  Hold abrader disc by orange tab. Rub abrader in 40 strokes over the upper left chest as  indicated in your monitor instructions.  Clean area with 4 enclosed alcohol pads. Let dry.  Apply patch as indicated in monitor instructions. Patch will be placed under collarbone on left  side of chest with arrow pointing upward.  Rub patch adhesive wings for 2 minutes. Remove white label marked "1". Remove the white  label marked "2". Rub patch adhesive wings for 2 additional minutes.  While looking in a mirror, press and release button in center of patch. A small green light will  flash 3-4 times. This will be your only indicator that the monitor has been turned on.  Do not shower for the first 24 hours. You may shower after the first 24 hours.  Press the button if you feel a  symptom. You will hear a small click. Record Date, Time and  Symptom in the Patient Logbook.  When you are ready to remove the patch, follow instructions on the last 2 pages of Patient  Logbook. Stick patch monitor onto the last page of Patient Logbook.  Place Patient Logbook in the blue and white box. Use locking tab on box and tape box closed  securely. The blue and white box has prepaid postage on it. Please place it in the mailbox as  soon as possible. Your  physician should have your test results approximately 7 days after the  monitor has been mailed back to [facility].  Call [facility] Customer Care at [phone] if you have questions regarding  your ZIO XT patch monitor. Call them immediately if you see an orange light blinking on your  monitor.  If your monitor falls off in less than 4 days, contact our Monitor department at [phone].  If your monitor becomes loose or falls off after 4 days call [employer] at [phone] for  suggestions on securing your monitor    Follow-Up: At [facility], you and your health needs are our priority.  As part of our continuing mission to provide you with exceptional heart care, we have created designated Provider Care Teams.  These Care Teams include your primary Cardiologist (physician) and Advanced Practice Providers (APPs -  Physician Assistants and Nurse Practitioners) who all work together to provide you with the care you need, when you need it.  We recommend signing up for the patient portal called "MyChart".  Sign up information is provided on this After Visit Summary.  MyChart is used to connect with patients for Virtual Visits (Telemedicine).  Patients are able to view lab/test results, encounter notes, upcoming appointments, etc.  Non-urgent messages can be sent to your provider as well.   To learn more about what you can do with MyChart, go to [email or web address].    Your next appointment:   2 month(s)  Provider:   Charlton Haws, MD  or Robin Searing, NP   Other Instructions  Check your blood pressure daily for 2 weeks, then contact the office with your readings.  Make sure to check 2 hours after your medications.   AVOID these things for 30 minutes before checking your blood pressure: No Drinking caffeine. No Drinking alcohol. No Eating. No Smoking. No Exercising.  Five minutes before checking your blood pressure: Pee. Sit in a dining chair. Avoid sitting in  a soft couch or armchair. Be quiet. Do not talk.

## 2023-05-23 DIAGNOSIS — E1165 Type 2 diabetes mellitus with hyperglycemia: Secondary | ICD-10-CM | POA: Diagnosis not present

## 2023-05-23 DIAGNOSIS — I10 Essential (primary) hypertension: Secondary | ICD-10-CM | POA: Diagnosis not present

## 2023-05-26 ENCOUNTER — Ambulatory Visit (HOSPITAL_COMMUNITY)
Admission: RE | Admit: 2023-05-26 | Discharge: 2023-05-26 | Disposition: A | Discharge status: Home / Self Care | Payer: Medicare Other | Source: Ambulatory Visit | Attending: Cardiovascular Disease | Admitting: Cardiovascular Disease

## 2023-05-26 ENCOUNTER — Other Ambulatory Visit: Payer: Self-pay | Admitting: Nurse Practitioner

## 2023-05-26 DIAGNOSIS — R9431 Abnormal electrocardiogram [ECG] [EKG]: Secondary | ICD-10-CM | POA: Diagnosis not present

## 2023-05-26 DIAGNOSIS — E785 Hyperlipidemia, unspecified: Secondary | ICD-10-CM | POA: Diagnosis not present

## 2023-05-26 DIAGNOSIS — I10 Essential (primary) hypertension: Secondary | ICD-10-CM | POA: Diagnosis not present

## 2023-05-26 DIAGNOSIS — R0989 Other specified symptoms and signs involving the circulatory and respiratory systems: Secondary | ICD-10-CM

## 2023-05-26 DIAGNOSIS — E1165 Type 2 diabetes mellitus with hyperglycemia: Secondary | ICD-10-CM

## 2023-05-27 ENCOUNTER — Telehealth: Payer: Self-pay | Admitting: Nurse Practitioner

## 2023-05-27 DIAGNOSIS — I471 Supraventricular tachycardia, unspecified: Secondary | ICD-10-CM

## 2023-05-27 DIAGNOSIS — R008 Other abnormalities of heart beat: Secondary | ICD-10-CM

## 2023-05-27 DIAGNOSIS — R55 Syncope and collapse: Secondary | ICD-10-CM

## 2023-05-27 DIAGNOSIS — R9431 Abnormal electrocardiogram [ECG] [EKG]: Secondary | ICD-10-CM

## 2023-05-27 MED ORDER — METOPROLOL SUCCINATE ER 25 MG PO TB24: 25.0000 mg | ORAL_TABLET | Freq: Every day | ORAL | 2 refills | Status: DC

## 2023-05-27 MED ORDER — MAGNESIUM OXIDE -MG SUPPLEMENT 200 MG PO TABS: 200.0000 mg | ORAL_TABLET | Freq: Every day | ORAL | 5 refills | Status: DC

## 2023-05-27 NOTE — Telephone Encounter (Signed)
   Cardiac Monitor Alert  Date of alert:  05/27/2023   Patient Name: Jacqueline Barnett  DOB: May 05, 1946  MRN: [id number]   [facility] Cardiologist: Charlton Haws, MD  [facility] EP:  None    Monitor Information: Long Term Monitor [ZioXT]  Reason:  presyncope Ordering provider:  Robin Searing NP/Dr. Eden Emms   Alert [employer] called to report abnormal tracing on the pt from 8/8 at 3:22 pm showing the pt to have SVT at a rate of 182 bpm lasting 60 secs (find on pg. 14 &15).  This came from pts final report    next Cardiology Appointment   Date:  10/22  Provider:  Robin Searing NP  The patient was contacted today.  She is asymptomatic. Pt states on 8/8 she did feel hard palpitations and pre-syncope.  She said it was a short duration.  Pt reports today that she is asymptomatic with no palpitation, fluttering, chest pain, sob, pre-syncope or syncope.   Pt is aware that I will place the referral to EP in the system and have the EP scheduler call her back to arrange this appt.  Advised her if symptoms of palpitations, fluttering, sob, chest pain, pre-syncope or syncope return, she should call our office back during business hours/or after hours, so that we can further advise on a medication to help slow her down.  She is also aware that Robin Searing NP and his team will probably be calling her soon as well, to give her the final interpretation of zio report.   Pt verbalized understanding and agrees with this plan.   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 Eden Emms, he resulted her final monitor report and sent this to Robin Searing NP to finalize being he ordered the zio on the pt for pre-syncope.  Per Dr. Eden Emms, pt should be referred to EP for significant long runs of short RP Tachycardiac.  EP referral placed and will send EP Schedulers a message to  call her back to schedule this appt.  Appt should be time sensitive.   The patient was contacted today.  She is asymptomatic. Pt states on 8/8 she did feel hard palpitations and pre-syncope.  She said it was a short duration.  Pt reports today that she is asymptomatic with no palpitation, fluttering, chest pain, sob, pre-syncope or syncope.   Pt is aware that I will place the referral to EP in the system and have the EP scheduler call her back to arrange this appt.  Advised her if symptoms of palpitations, fluttering, sob, chest pain, pre-syncope or syncope return, she should call our office back during business hours/or after hours, so that we can further advise on a medication to help slow her down.  She is also aware that Robin Searing NP and his team will probably be calling her soon as well, to give her the final interpretation of zio report.   Pt verbalized understanding and agrees with this plan.  Will send this information to Robin Searing NP and his covering CMA, to make them aware of this plan.   Loa Socks, LPN  7/82/9562 13:08 AM

## 2023-05-27 NOTE — Telephone Encounter (Addendum)
[employer] called to report abnormal tracing on the pt from 8/8 at 3:22 pm showing the pt to have SVT at a rate of 182 bpm lasting 60 secs (find on pg. 14 &15).  This came from pts final report.  Per [facility], they will be uploading this now and faxing this printed report to our office in the next few mins.

## 2023-05-27 NOTE — Telephone Encounter (Signed)
New note from Dr. Eden Emms below:   Jacqueline Stade, MD  [name], RN; Loa Socks, LPN; Gaston Islam., NP  Patient has significant prolonged episodes of short RP tachycardia. Start Toprol 25 mg daily Refer to EP to consider ablation. Take Mag Oxide over the counter K was fine    Pt updated and aware that we will start her on Toprol XL 25 mg po daily and Mg Oxide 200 mg po daily.  Confirmed the pharmacy of choice with the pt.   She is aware that we will still proceed with EP Referral and will expect a call back soon from our EP Schedulers, to get this arranged.   Pt verbalized understanding and agrees with this plan.

## 2023-05-27 NOTE — Telephone Encounter (Signed)
Jacqueline Barnett - [employer] calling to give abnormal result

## 2023-05-30 ENCOUNTER — Telehealth: Payer: Self-pay | Admitting: *Deleted

## 2023-05-30 NOTE — Telephone Encounter (Signed)
-----   Message from Ronn Melena sent at 05/30/2023  4:47 PM EDT ----- Regarding: RE: REFER TO EP PER DR. Eden Emms AND ERNEST DICK NP FOR CONSIDERATION OF ABLATION Pt is scheduled with Dr. Ladona Ridgel on 06/01/23   [employer] ----- Message ----- From: Loa Socks, LPN Sent: 03/07/5408  11:23 AM EDT To: Alveta Heimlich, CMA; Ronn Melena; # Subject: REFER TO EP PER DR. Eden Emms AND ERNEST DICK N#  This pt needs to be referred to EP for consideration of ablation due to abnormal heart monitor  Showing significant prolonged episodes of short RP tachycardia.  Referral is in the system.  This appt is pretty time sensitive, so very next available is preferred for you to schedule her at.  Pt is aware that you will call her to arrange  Can you please call her and schedule this and send the date to Melina Schools CMA and Antonietta Breach for their follow-up?   Thanks [employer]

## 2023-06-01 ENCOUNTER — Ambulatory Visit (INDEPENDENT_AMBULATORY_CARE_PROVIDER_SITE_OTHER): Payer: Medicare Other

## 2023-06-01 ENCOUNTER — Encounter: Payer: Self-pay | Admitting: Internal Medicine

## 2023-06-01 ENCOUNTER — Ambulatory Visit: Payer: Medicare Other | Attending: Internal Medicine | Admitting: Internal Medicine

## 2023-06-01 VITALS — BP 122/68 | HR 58 | Ht 63.0 in | Wt 165.0 lb

## 2023-06-01 DIAGNOSIS — I471 Supraventricular tachycardia, unspecified: Secondary | ICD-10-CM | POA: Diagnosis not present

## 2023-06-01 NOTE — Patient Instructions (Addendum)
Medication Instructions:  Your physician recommends that you continue on your current medications as directed. Please refer to the Current Medication list given to you today.  *If you need a refill on your cardiac medications before your next appointment, please call your pharmacy*  Lab Work: None today.  If you have labs (blood work) drawn today and your tests are completely normal, you will receive your results only by: MyChart Message (if you have MyChart) OR A paper copy in the mail If you have any lab test that is abnormal or we need to change your treatment, we will call you to review the results.  Testing/Procedures: Your physician has requested that you wear a Zio heart monitor for 3 days. This will be mailed to your home with instructions on how to apply the monitor and how to return it when finished. Please allow 2 weeks after returning the heart monitor before our office calls you with the results.   Follow-Up: At [facility], you and your health needs are our priority.  As part of our continuing mission to provide you with exceptional heart care, we have created designated Provider Care Teams.  These Care Teams include your primary Cardiologist (physician) and Advanced Practice Providers (APPs -  Physician Assistants and Nurse Practitioners) who all work together to provide you with the care you need, when you need it.  Your next appointment:   October 2024  Important Information About Sugar

## 2023-06-01 NOTE — Progress Notes (Signed)
HPI Jacqueline Barnett is referred today by Dr. Eden Emms for evaluation of SVT. She is a pleasant 77 yo woman with episodic sob and palpitations who was found to have recurrent and persistent SVT. She has been started on toprol. She is unsure if she is better. She has not had syncope. No chest pain. She has longstanding HTN.  No Known Allergies   Current Outpatient Medications  Medication Sig Dispense Refill   ACCU-CHEK SMARTVIEW test strip      albuterol (VENTOLIN HFA) 108 (90 Base) MCG/ACT inhaler Inhale 1 puff into the lungs every 6 (six) hours as needed for shortness of breath or wheezing.     Ascorbic Acid (VITAMIN C PO) Take 1 tablet by mouth daily.     aspirin 81 MG chewable tablet Chew by mouth daily.     Blood Pressure Monitoring (BLOOD PRESSURE CUFF) MISC 1 each by Does not apply route daily. 1 each 0   BREO ELLIPTA 100-25 MCG/ACT AEPB Inhale 1 puff into the lungs daily.     clobetasol cream (TEMOVATE) 0.05 % Apply 1 Application topically 2 (two) times daily. 30 g 0   dapagliflozin propanediol (FARXIGA) 10 MG TABS tablet Take 10 mg by mouth daily.     famotidine (PEPCID) 20 MG tablet Take 20 mg by mouth 2 (two) times daily.     fluticasone (FLONASE) 50 MCG/ACT nasal spray Place 2 sprays into both nostrils daily. 16 g 2   hydrochlorothiazide (HYDRODIURIL) 25 MG tablet Take 25 mg by mouth daily.     LINZESS 145 MCG CAPS capsule Take 145 mcg by mouth daily.     losartan (COZAAR) 50 MG tablet TAKE 1 TABLET(50 MG) BY MOUTH DAILY 90 tablet 2   Magnesium Oxide -Mg Supplement 200 MG TABS Take 1 tablet (200 mg total) by mouth daily. 30 tablet 5   metFORMIN (GLUCOPHAGE) 500 MG tablet Take by mouth 2 (two) times daily with a meal.     metoprolol succinate (TOPROL XL) 25 MG 24 hr tablet Take 1 tablet (25 mg total) by mouth daily. 90 tablet 2   Multiple Vitamins-Minerals (MULTIVITAMIN ADULTS 50+) TABS Take 1 tablet by mouth daily.     nystatin cream (MYCOSTATIN) as needed.     Omega-3 Fatty  Acids (FISH OIL OMEGA-3 PO) Take 1 capsule by mouth daily.     simvastatin (ZOCOR) 40 MG tablet Take 40 mg by mouth daily at 6 PM.      triamcinolone ointment (KENALOG) 0.5 % Apply 1 Application topically as needed.     VITAMIN D, CHOLECALCIFEROL, PO Take 1 tablet by mouth daily.     No current facility-administered medications for this visit.     Past Medical History:  Diagnosis Date   Diabetes mellitus without complication (HCC)    Hypercholesteremia    Hypertension     ROS:   All systems reviewed and negative except as noted in the HPI.   Past Surgical History:  Procedure Laterality Date   ABDOMINAL HYSTERECTOMY     FOOT SURGERY       Family History  Problem Relation Age of Onset   Diabetes Other      Social History   Socioeconomic History   Marital status: Widowed    Spouse name: Not on file   Number of children: Not on file   Years of education: Not on file   Highest education level: Not on file  Occupational History   Not on file  Tobacco Use  Smoking status: Every Day   Smokeless tobacco: Never  Substance and Sexual Activity   Alcohol use: No   Drug use: Not on file   Sexual activity: Not on file  Other Topics Concern   Not on file  Social History Narrative   Not on file   Social Determinants of Health   Financial Resource Strain: Not on file  Food Insecurity: Not on file  Transportation Needs: Not on file  Physical Activity: Not on file  Stress: Not on file  Social Connections: Not on file  Intimate Partner Violence: Not on file     BP 122/68   Pulse (!) 58   Ht 5\' 3"  (1.6 m)   Wt 165 lb (74.8 kg)   SpO2 98%   BMI 29.23 kg/m   Physical Exam:  Well appearing NAD HEENT: Unremarkable Neck:  No JVD, no thyromegally Lymphatics:  No adenopathy Back:  No CVA tenderness Lungs:  Clear with no wheezes HEART:  Regular rate rhythm, no murmurs, no rubs, no clicks Abd:  soft, positive bowel sounds, no organomegally, no rebound, no  guarding Ext:  2 plus pulses, no edema, no cyanosis, no clubbing Skin:  No rashes no nodules Neuro:  CN II through XII intact, motor grossly intact   Assess/Plan: Recurrent SVT - I have discussed the treatment options with the patient and her daughter. She has been on toprol. She would like to see how the toprol has effected her arrhythmia burden. If she is still having SVT, I would recommend proceeding with catheter ablation. If better, then watchful waiting.  Sharlot Gowda Fard Borunda,MD

## 2023-06-01 NOTE — Progress Notes (Unsigned)
ZIO XT serial # [id number] from our office inventory applied to patient.

## 2023-06-03 DIAGNOSIS — E119 Type 2 diabetes mellitus without complications: Secondary | ICD-10-CM | POA: Diagnosis not present

## 2023-06-03 DIAGNOSIS — Z961 Presence of intraocular lens: Secondary | ICD-10-CM | POA: Diagnosis not present

## 2023-06-03 DIAGNOSIS — H43813 Vitreous degeneration, bilateral: Secondary | ICD-10-CM | POA: Diagnosis not present

## 2023-06-04 DIAGNOSIS — E1165 Type 2 diabetes mellitus with hyperglycemia: Secondary | ICD-10-CM | POA: Diagnosis not present

## 2023-06-04 DIAGNOSIS — I10 Essential (primary) hypertension: Secondary | ICD-10-CM | POA: Diagnosis not present

## 2023-06-10 DIAGNOSIS — I4719 Other supraventricular tachycardia: Secondary | ICD-10-CM | POA: Diagnosis not present

## 2023-06-16 DIAGNOSIS — Z0000 Encounter for general adult medical examination without abnormal findings: Secondary | ICD-10-CM | POA: Diagnosis not present

## 2023-06-22 DIAGNOSIS — E1165 Type 2 diabetes mellitus with hyperglycemia: Secondary | ICD-10-CM | POA: Diagnosis not present

## 2023-06-22 DIAGNOSIS — I10 Essential (primary) hypertension: Secondary | ICD-10-CM | POA: Diagnosis not present

## 2023-07-04 DIAGNOSIS — I10 Essential (primary) hypertension: Secondary | ICD-10-CM | POA: Diagnosis not present

## 2023-07-04 DIAGNOSIS — E1165 Type 2 diabetes mellitus with hyperglycemia: Secondary | ICD-10-CM | POA: Diagnosis not present

## 2023-07-22 DIAGNOSIS — I10 Essential (primary) hypertension: Secondary | ICD-10-CM | POA: Diagnosis not present

## 2023-07-22 DIAGNOSIS — E1165 Type 2 diabetes mellitus with hyperglycemia: Secondary | ICD-10-CM | POA: Diagnosis not present

## 2023-07-25 NOTE — Progress Notes (Unsigned)
Cardiology Office Note    Patient Name: Jacqueline Barnett Date of Encounter: 07/25/2023  Primary Care Provider:  Georgann Housekeeper, MD Primary Cardiologist:  Charlton Haws, MD Primary Electrophysiologist: None   Past Medical History    Past Medical History:  Diagnosis Date   Diabetes mellitus without complication (HCC)    Hypercholesteremia    Hypertension     History of Present Illness  Jacqueline Barnett is a 77 year old female with a PMH of paroxysmal SVT, HTN, HLD, DM type II, COPD, prior tobacco abuse, who presents today for follow-up.  Jacqueline Barnett was last seen 05/10/2023 for follow-up and reported complaints of shortness of breath and dizziness with swimmy headedness.  Her blood pressure was slightly elevated 138/78 and heart rate was 60 bpm.  Patient wore an event monitor for further evaluation that revealed SVT rate of 182 bpm.  She was started on Toprol 25 mg daily and referred to EP for consideration of ablation.  She was seen by Dr. Ladona Ridgel on 06/01/2023 for consultation of new onset SVT.  She was advised of treatment options and patient and her daughter elected to see if Toprol-XL controls her symptoms.  He was advised that if SVT recurs recommendation would be to proceed with catheter ablation.  She wore a repeat event monitor on 07/13/2023 that showed brief episodes of SVT with no prolonged pauses and rare PACs.  She was advised if symptoms recur she would return to the office to discuss further treatment options.  During today's visit the patient reports*** .  Patient denies chest pain, palpitations, dyspnea, PND, orthopnea, nausea, vomiting, dizziness, syncope, edema, weight gain, or early satiety.  ***Notes: -Last ischemic evaluation: -Last echo: -Interim ED visits: Review of Systems  Please see the history of present illness.    All other systems reviewed and are otherwise negative except as noted above.  Physical Exam    Wt Readings from Last 3 Encounters:   06/01/23 165 lb (74.8 kg)  05/10/23 165 lb (74.8 kg)  02/04/23 166 lb 3.2 oz (75.4 kg)   ZO:XWRUE were no vitals filed for this visit.,There is no height or weight on file to calculate BMI. GEN: Well nourished, well developed in no acute distress Neck: No JVD; No carotid bruits Pulmonary: Clear to auscultation without rales, wheezing or rhonchi  Cardiovascular: Normal rate. Regular rhythm. Normal S1. Normal S2.   Murmurs: There is no murmur.  ABDOMEN: Soft, non-tender, non-distended EXTREMITIES:  No edema; No deformity   EKG/LABS/ Recent Cardiac Studies   ECG personally reviewed by me today - ***  Risk Assessment/Calculations:   {Does this patient have ATRIAL FIBRILLATION?:[phone]}      Lab Results  Component Value Date   WBC 6.6 05/10/2023   HGB 14.1 05/10/2023   HCT 41.7 05/10/2023   MCV 89 05/10/2023   PLT 240 05/10/2023   Lab Results  Component Value Date   CREATININE 1.01 (H) 05/10/2023   BUN 26 05/10/2023   NA 137 05/10/2023   K 3.9 05/10/2023   CL 96 05/10/2023   CO2 24 05/10/2023   Lab Results  Component Value Date   CHOL 138 09/20/2019   HDL 54 09/20/2019   LDLCALC 66 09/20/2019   TRIG 88 09/20/2019   CHOLHDL 2.6 09/20/2019    Lab Results  Component Value Date   HGBA1C 8.2 (H) 09/18/2019   Assessment & Plan    1.  Paroxysmal SVT: -Patient was diagnosed with SVT and started on Toprol-XL but referred to  EP with decision to monitor on new beta-blocker.  She had repeat event monitor completed that showed no sustained arrhythmias with beta-blocker. -Today patient reports***  2.Essential hypertension: -Patient's blood pressure today was  3.DM type II: -Patient's last hemoglobin A1c was 7.4 -She is currently on Farxiga  4.Hyperlipidemia: -Patient's last LDL cholesterol was       Disposition: Follow-up with Charlton Haws, MD or APP in *** months {Are you ordering a CV Procedure (e.g. stress test, cath, DCCV, TEE, etc)?   Press F2         :[id number]}   Signed, Napoleon Form, Leodis Rains, NP 07/25/2023, 5:04 PM [facility]

## 2023-07-26 ENCOUNTER — Ambulatory Visit: Payer: Medicare Other | Attending: Nurse Practitioner | Admitting: Nurse Practitioner

## 2023-07-26 ENCOUNTER — Encounter: Payer: Self-pay | Admitting: Nurse Practitioner

## 2023-07-26 ENCOUNTER — Telehealth: Payer: Self-pay | Admitting: *Deleted

## 2023-07-26 VITALS — BP 132/68 | HR 62 | Ht 63.0 in | Wt 165.0 lb

## 2023-07-26 DIAGNOSIS — G479 Sleep disorder, unspecified: Secondary | ICD-10-CM | POA: Diagnosis not present

## 2023-07-26 DIAGNOSIS — R0602 Shortness of breath: Secondary | ICD-10-CM | POA: Diagnosis not present

## 2023-07-26 DIAGNOSIS — E1165 Type 2 diabetes mellitus with hyperglycemia: Secondary | ICD-10-CM

## 2023-07-26 DIAGNOSIS — E785 Hyperlipidemia, unspecified: Secondary | ICD-10-CM | POA: Diagnosis not present

## 2023-07-26 DIAGNOSIS — I471 Supraventricular tachycardia, unspecified: Secondary | ICD-10-CM | POA: Diagnosis not present

## 2023-07-26 DIAGNOSIS — I10 Essential (primary) hypertension: Secondary | ICD-10-CM

## 2023-07-26 DIAGNOSIS — M79622 Pain in left upper arm: Secondary | ICD-10-CM

## 2023-07-26 MED ORDER — METOPROLOL SUCCINATE ER 25 MG PO TB24: 25.0000 mg | ORAL_TABLET | Freq: Every day | ORAL | Status: DC

## 2023-07-26 NOTE — Telephone Encounter (Signed)
Robin Searing, NP ORDERED ITAMAR.  Patient agreement reviewed and signed on 07/26/2023.  WatchPAT issued to patient on 07/26/2023 by Danielle Rankin, CMA. Patient aware to not open the WatchPAT box until contacted with the activation PIN. Patient profile initialized in CloudPAT on 07/26/2023 by Danielle Rankin, CMA. Device serial number: [id number]  Please list Reason for Call as Advice Only and type "WatchPAT issued to patient" in the comment box.

## 2023-07-26 NOTE — Patient Instructions (Signed)
Medication Instructions:  Can take a 12.5mg  dose of Metoprolol as needed  *If you need a refill on your cardiac medications before your next appointment, please call your pharmacy*   Lab Work: None ordered   Testing/Procedures: Your physician has requested that you have a lexiscan myoview. For further information please visit [email or web address] Please follow instruction sheet, as given.  Itamar Sleep Study   Follow-Up: At [facility], you and your health needs are our priority.  As part of our continuing mission to provide you with exceptional heart care, we have created designated Provider Care Teams.  These Care Teams include your primary Cardiologist (physician) and Advanced Practice Providers (APPs -  Physician Assistants and Nurse Practitioners) who all work together to provide you with the care you need, when you need it.  We recommend signing up for the patient portal called "MyChart".  Sign up information is provided on this After Visit Summary.  MyChart is used to connect with patients for Virtual Visits (Telemedicine).  Patients are able to view lab/test results, encounter notes, upcoming appointments, etc.  Non-urgent messages can be sent to your provider as well.   To learn more about what you can do with MyChart, go to [email or web address].    Your next appointment:   2 month(s)  Provider:   Robin Searing, NP       Other Instructions

## 2023-07-28 DIAGNOSIS — J439 Emphysema, unspecified: Secondary | ICD-10-CM | POA: Diagnosis not present

## 2023-07-28 DIAGNOSIS — N1831 Chronic kidney disease, stage 3a: Secondary | ICD-10-CM | POA: Diagnosis not present

## 2023-07-28 DIAGNOSIS — I471 Supraventricular tachycardia, unspecified: Secondary | ICD-10-CM | POA: Diagnosis not present

## 2023-07-28 DIAGNOSIS — E1122 Type 2 diabetes mellitus with diabetic chronic kidney disease: Secondary | ICD-10-CM | POA: Diagnosis not present

## 2023-07-28 DIAGNOSIS — I700 Atherosclerosis of aorta: Secondary | ICD-10-CM | POA: Diagnosis not present

## 2023-07-28 DIAGNOSIS — E785 Hyperlipidemia, unspecified: Secondary | ICD-10-CM | POA: Diagnosis not present

## 2023-07-28 DIAGNOSIS — I519 Heart disease, unspecified: Secondary | ICD-10-CM | POA: Diagnosis not present

## 2023-07-28 NOTE — Telephone Encounter (Signed)
Spoke with the patient and advised that we are waiting on approval from her insurance company. She is aware that we will call her with the pin# once we receive approval.

## 2023-07-28 NOTE — Telephone Encounter (Signed)
Patient is following up requesting code for Itamar.

## 2023-08-03 ENCOUNTER — Telehealth (HOSPITAL_COMMUNITY): Payer: Self-pay | Admitting: *Deleted

## 2023-08-03 NOTE — Telephone Encounter (Signed)
Patient given detailed instructions per Myocardial Perfusion Study Information Sheet for the test on 08/09/2023 at 8:00. Patient notified to arrive 15 minutes early and that it is imperative to arrive on time for appointment to keep from having the test rescheduled.  If you need to cancel or reschedule your appointment, please call the office within 24 hours of your appointment. . Patient verbalized understanding.Jacqueline Barnett

## 2023-08-04 DIAGNOSIS — E1165 Type 2 diabetes mellitus with hyperglycemia: Secondary | ICD-10-CM | POA: Diagnosis not present

## 2023-08-04 DIAGNOSIS — I10 Essential (primary) hypertension: Secondary | ICD-10-CM | POA: Diagnosis not present

## 2023-08-09 ENCOUNTER — Ambulatory Visit (HOSPITAL_COMMUNITY): Payer: Medicare Other | Attending: Nurse Practitioner

## 2023-08-09 DIAGNOSIS — E1165 Type 2 diabetes mellitus with hyperglycemia: Secondary | ICD-10-CM | POA: Insufficient documentation

## 2023-08-09 DIAGNOSIS — I471 Supraventricular tachycardia, unspecified: Secondary | ICD-10-CM | POA: Insufficient documentation

## 2023-08-09 DIAGNOSIS — E785 Hyperlipidemia, unspecified: Secondary | ICD-10-CM | POA: Insufficient documentation

## 2023-08-09 DIAGNOSIS — R0609 Other forms of dyspnea: Secondary | ICD-10-CM | POA: Diagnosis not present

## 2023-08-09 DIAGNOSIS — I10 Essential (primary) hypertension: Secondary | ICD-10-CM | POA: Insufficient documentation

## 2023-08-09 DIAGNOSIS — M79622 Pain in left upper arm: Secondary | ICD-10-CM | POA: Diagnosis not present

## 2023-08-09 DIAGNOSIS — M79602 Pain in left arm: Secondary | ICD-10-CM | POA: Insufficient documentation

## 2023-08-09 DIAGNOSIS — R0602 Shortness of breath: Secondary | ICD-10-CM

## 2023-08-09 LAB — MYOCARDIAL PERFUSION IMAGING
LV dias vol: 63 mL (ref 46–106)
LV sys vol: 20 mL
Nuc Stress EF: 69 %
Peak HR: 78 {beats}/min
Rest HR: 58 {beats}/min
Rest Nuclear Isotope Dose: 7.6 mCi
SDS: 4
SRS: 0
SSS: 4
ST Depression (mm): 0 mm
Stress Nuclear Isotope Dose: 28.2 mCi
TID: 1.03

## 2023-08-09 MED ORDER — REGADENOSON 0.4 MG/5ML IV SOLN
0.4000 mg | Freq: Once | INTRAVENOUS | Status: AC
  Administered 2023-08-09: 0.4 mg via INTRAVENOUS

## 2023-08-09 MED ORDER — TECHNETIUM TC 99M TETROFOSMIN IV KIT
7.6000 | PACK | Freq: Once | INTRAVENOUS | Status: AC | PRN
  Administered 2023-08-09: 7.6 via INTRAVENOUS

## 2023-08-09 MED ORDER — TECHNETIUM TC 99M TETROFOSMIN IV KIT
28.2000 | PACK | Freq: Once | INTRAVENOUS | Status: AC | PRN
  Administered 2023-08-09: 28.2 via INTRAVENOUS

## 2023-08-18 ENCOUNTER — Other Ambulatory Visit: Payer: Self-pay | Admitting: Internal Medicine

## 2023-08-18 DIAGNOSIS — Z0000 Encounter for general adult medical examination without abnormal findings: Secondary | ICD-10-CM

## 2023-08-21 DIAGNOSIS — E1165 Type 2 diabetes mellitus with hyperglycemia: Secondary | ICD-10-CM | POA: Diagnosis not present

## 2023-08-21 DIAGNOSIS — I10 Essential (primary) hypertension: Secondary | ICD-10-CM | POA: Diagnosis not present

## 2023-08-31 ENCOUNTER — Ambulatory Visit: Payer: Medicare Other | Attending: Nurse Practitioner

## 2023-08-31 DIAGNOSIS — E1165 Type 2 diabetes mellitus with hyperglycemia: Secondary | ICD-10-CM

## 2023-08-31 DIAGNOSIS — E785 Hyperlipidemia, unspecified: Secondary | ICD-10-CM

## 2023-08-31 DIAGNOSIS — I471 Supraventricular tachycardia, unspecified: Secondary | ICD-10-CM

## 2023-08-31 DIAGNOSIS — I10 Essential (primary) hypertension: Secondary | ICD-10-CM

## 2023-09-03 DIAGNOSIS — I10 Essential (primary) hypertension: Secondary | ICD-10-CM | POA: Diagnosis not present

## 2023-09-03 DIAGNOSIS — E1165 Type 2 diabetes mellitus with hyperglycemia: Secondary | ICD-10-CM | POA: Diagnosis not present

## 2023-09-13 NOTE — Telephone Encounter (Signed)
**Note De-Identified Kenesha Moshier Obfuscation** Ordering provider: Robin Searing, NP Associated diagnoses: Somnolence-R40.0 and Sleep disturbance-G47.9 WatchPAT PA obtained on 09/13/2023 by Kalee Broxton, Lorelle Formosa, LPN. Authorization: Per [employer], No PA required: Procedure code 40981 Description Sleep study, unattended, simultaneous recording; heart rate, oxygen saturation, respiratory analysis (eg, by airflow or peripheral arterial tone), and sleep time Inquiry summary Notification/Prior Authorization not required for this service. Patient notified of PIN ([id number]) on 09/13/2023 Arvin Abello Notification Method: phone.  Phone note routed to covering staff for follow-up.

## 2023-09-16 ENCOUNTER — Telehealth: Payer: Self-pay | Admitting: Cardiovascular Disease

## 2023-09-16 NOTE — Telephone Encounter (Signed)
Patient stated she had not started her sleep study as yet and wants to know if she can still start it tomorrow.

## 2023-09-16 NOTE — Telephone Encounter (Signed)
**Note De-Identified Nayara Taplin Obfuscation** The pt is advised that she can do her WatchPAT One-HST this weekend. She verbalized understanding and thanked me for returning her call.

## 2023-09-17 ENCOUNTER — Encounter (INDEPENDENT_AMBULATORY_CARE_PROVIDER_SITE_OTHER): Payer: Medicare Other | Admitting: Cardiology

## 2023-09-17 DIAGNOSIS — G4733 Obstructive sleep apnea (adult) (pediatric): Secondary | ICD-10-CM

## 2023-09-19 ENCOUNTER — Encounter: Payer: Self-pay | Admitting: Nurse Practitioner

## 2023-09-20 ENCOUNTER — Ambulatory Visit: Payer: Medicare Other | Admitting: Nurse Practitioner

## 2023-09-20 DIAGNOSIS — I10 Essential (primary) hypertension: Secondary | ICD-10-CM | POA: Diagnosis not present

## 2023-09-20 DIAGNOSIS — E1165 Type 2 diabetes mellitus with hyperglycemia: Secondary | ICD-10-CM | POA: Diagnosis not present

## 2023-09-30 NOTE — Procedures (Signed)
   SLEEP STUDY REPORT Patient Information Study Date: 09/17/2023 Patient Name: Jacqueline Barnett Patient ID: [id number] Birth Date: 02/06/1946 Age: 77 Gender: Female BMI: 29.3 (W=165 lb, H=5' 3'') Stopbang: 5 Referring Physician: Robin Searing, NP  TEST DESCRIPTION: Home sleep apnea testing was completed using the WatchPat, a Type 1 device, utilizing  peripheral arterial tonometry (PAT), chest movement, actigraphy, pulse oximetry, pulse rate, body position and snore.  AHI was calculated with apnea and hypopnea using valid sleep time as the denominator. RDI includes apneas,  hypopneas, and RERAs. The data acquired and the scoring of sleep and all associated events were performed in  accordance with the recommended standards and specifications as outlined in the AASM Manual for the Scoring of  Sleep and Associated Events 2.2.0 (2015). FINDINGS:   1. Severe Obstructive Sleep Apnea with AHI 29.7/hr.   2. Mild Central Sleep Apnea with pAHIc 10.1/hr.  3. Oxygen desaturations as low as 82%.  4. Moderate to severe snoring was present. O2 sats were < 88% for 27.9 min.  5. Total sleep time was 8 hrs and 53 min.  6. 28.3% of total sleep time was spent in REM sleep.   7. Shortened sleep onset latency at 9 min.   8. Shortened REM sleep onset latency at 65 min.   9. Total awakenings were 6.  10. Arrhythmia detection: None  DIAGNOSIS:  Severe Obstructive Sleep Apnea (G47.33) Nocturnal Hypoxemia  RECOMMENDATIONS: 1. Clinical correlation of these findings is necessary. The decision to treat obstructive sleep apnea (OSA) is usually  based on the presence of apnea symptoms or the presence of associated medical conditions such as Hypertension,  Congestive Heart Failure, Atrial Fibrillation or Obesity. The most common symptoms of OSA are snoring, gasping for  breath while sleeping, daytime sleepiness and fatigue.   2. Initiating apnea therapy is recommended given the presence of symptoms and/or  associated conditions.  Recommend proceeding with one of the following:   a. Auto-CPAP therapy with a pressure range of 5-20cm H2O.   b. An oral appliance (OA) that can be obtained from certain dentists with expertise in sleep medicine. These are  primarily of use in non-obese patients with mild and moderate disease.   c. An ENT consultation which may be useful to look for specific causes of obstruction and possible treatment  options.   d. If patient is intolerant to PAP therapy, consider referral to ENT for evaluation for hypoglossal nerve stimulator.   3. Close follow-up is necessary to ensure success with CPAP or oral appliance therapy for maximum benefit .  4. A follow-up oximetry study on CPAP is recommended to assess the adequacy of therapy and determine the need  for supplemental oxygen or the potential need for Bi-level therapy. An arterial blood gas to determine the adequacy of  baseline ventilation and oxygenation should also be considered.  5. Healthy sleep recommendations include: adequate nightly sleep (normal 7-9 hrs/night), avoidance of caffeine after  noon and alcohol near bedtime, and maintaining a sleep environment that is cool, dark and quiet.  6. Weight loss for overweight patients is recommended. Even modest amounts of weight loss can significantly  improve the severity of sleep apnea.  7. Snoring recommendations include: weight loss where appropriate, side sleeping, and avoidance of alcohol before  bed.  8. Operation of motor vehicle should be avoided when sleepy.  Signature: Armanda Magic, MD; [facility]; Diplomat, American Board of Sleep  Medicine Electronically Signed: 09/30/2023 4:06:27 PM

## 2023-10-03 ENCOUNTER — Ambulatory Visit: Payer: Medicare Other

## 2023-10-03 NOTE — Telephone Encounter (Signed)
Left detailed message per DPR for patient to call office back and inform if she's done/intends to complete itamar sleep study.Chart shows status of "in process." Awaiting call back.

## 2023-10-04 DIAGNOSIS — E1165 Type 2 diabetes mellitus with hyperglycemia: Secondary | ICD-10-CM | POA: Diagnosis not present

## 2023-10-04 DIAGNOSIS — I10 Essential (primary) hypertension: Secondary | ICD-10-CM | POA: Diagnosis not present

## 2023-10-11 ENCOUNTER — Ambulatory Visit
Admission: RE | Admit: 2023-10-11 | Discharge: 2023-10-11 | Disposition: A | Discharge status: Home / Self Care | Payer: Medicare Other | Source: Ambulatory Visit | Attending: Internal Medicine | Admitting: Internal Medicine

## 2023-10-11 ENCOUNTER — Telehealth: Payer: Self-pay | Admitting: *Deleted

## 2023-10-11 DIAGNOSIS — I10 Essential (primary) hypertension: Secondary | ICD-10-CM

## 2023-10-11 DIAGNOSIS — G4733 Obstructive sleep apnea (adult) (pediatric): Secondary | ICD-10-CM

## 2023-10-11 DIAGNOSIS — Z0000 Encounter for general adult medical examination without abnormal findings: Secondary | ICD-10-CM

## 2023-10-11 NOTE — Telephone Encounter (Signed)
-----   Message from Wilbert Bihari sent at 09/30/2023  4:07 PM EST ----- Please let patient know that they have sleep apnea.  Recommend therapeutic CPAP titration for treatment of patient's sleep disordered breathing.  If unable to perform an in lab titration then initiate ResMed auto CPAP from 4 to 15cm H2O with heated humidity and mask of choice and overnight pulse ox on CPAP.

## 2023-10-11 NOTE — Telephone Encounter (Signed)
 The patient has been notified of the result and verbalized understanding.  All questions (if any) were answered. Latrelle Dodrill, CMA 10/11/2023 5:20 PM    Will precert titration

## 2023-10-21 ENCOUNTER — Other Ambulatory Visit: Payer: Self-pay

## 2023-10-21 ENCOUNTER — Emergency Department (HOSPITAL_BASED_OUTPATIENT_CLINIC_OR_DEPARTMENT_OTHER)
Admission: EM | Admit: 2023-10-21 | Discharge: 2023-10-21 | Disposition: A | Discharge status: Home / Self Care | Payer: HMO | Attending: Emergency Medicine | Admitting: Emergency Medicine

## 2023-10-21 ENCOUNTER — Encounter (HOSPITAL_BASED_OUTPATIENT_CLINIC_OR_DEPARTMENT_OTHER): Payer: Self-pay

## 2023-10-21 ENCOUNTER — Emergency Department (HOSPITAL_BASED_OUTPATIENT_CLINIC_OR_DEPARTMENT_OTHER): Payer: HMO

## 2023-10-21 DIAGNOSIS — E119 Type 2 diabetes mellitus without complications: Secondary | ICD-10-CM | POA: Diagnosis not present

## 2023-10-21 DIAGNOSIS — Z7984 Long term (current) use of oral hypoglycemic drugs: Secondary | ICD-10-CM | POA: Insufficient documentation

## 2023-10-21 DIAGNOSIS — I471 Supraventricular tachycardia, unspecified: Secondary | ICD-10-CM | POA: Insufficient documentation

## 2023-10-21 DIAGNOSIS — Z79899 Other long term (current) drug therapy: Secondary | ICD-10-CM | POA: Diagnosis not present

## 2023-10-21 DIAGNOSIS — Z87891 Personal history of nicotine dependence: Secondary | ICD-10-CM | POA: Insufficient documentation

## 2023-10-21 DIAGNOSIS — Z7982 Long term (current) use of aspirin: Secondary | ICD-10-CM | POA: Diagnosis not present

## 2023-10-21 DIAGNOSIS — E860 Dehydration: Secondary | ICD-10-CM | POA: Diagnosis not present

## 2023-10-21 DIAGNOSIS — I10 Essential (primary) hypertension: Secondary | ICD-10-CM | POA: Diagnosis not present

## 2023-10-21 DIAGNOSIS — R000 Tachycardia, unspecified: Secondary | ICD-10-CM | POA: Diagnosis present

## 2023-10-21 LAB — BASIC METABOLIC PANEL
Anion gap: 13 (ref 5–15)
BUN: 26 mg/dL — ABNORMAL HIGH (ref 8–23)
CO2: 25 mmol/L (ref 22–32)
Calcium: 10.3 mg/dL (ref 8.9–10.3)
Chloride: 98 mmol/L (ref 98–111)
Creatinine, Ser: 1.42 mg/dL — ABNORMAL HIGH (ref 0.44–1.00)
GFR, Estimated: 38 mL/min — ABNORMAL LOW (ref >60)
Glucose, Bld: 174 mg/dL — ABNORMAL HIGH (ref 70–99)
Potassium: 3.6 mmol/L (ref 3.5–5.1)
Sodium: 136 mmol/L (ref 135–145)

## 2023-10-21 LAB — CBC
HCT: 42.5 % (ref 36.0–46.0)
Hemoglobin: 14.3 g/dL (ref 12.0–15.0)
MCH: 29.3 pg (ref 26.0–34.0)
MCHC: 33.6 g/dL (ref 30.0–36.0)
MCV: 87.1 fL (ref 80.0–100.0)
Platelets: 262 10*3/uL (ref 150–400)
RBC: 4.88 MIL/uL (ref 3.87–5.11)
RDW: 13.3 % (ref 11.5–15.5)
WBC: 6.3 10*3/uL (ref 4.0–10.5)
nRBC: 0 % (ref 0.0–0.2)

## 2023-10-21 MED ORDER — SODIUM CHLORIDE 0.9 % IV BOLUS
1000.0000 mL | Freq: Once | INTRAVENOUS | Status: AC
  Administered 2023-10-21: 1000 mL via INTRAVENOUS

## 2023-10-21 NOTE — ED Notes (Signed)
Pt discharged home and given discharge paperwork. Opportunities given for questions. Pt verbalizes understanding. PIV removed x1. Stone,Heather R , RN 

## 2023-10-21 NOTE — ED Provider Notes (Signed)
[place] [facility]MENT AT [facility] Provider Note  CSN: [id number] Arrival date & time: 10/21/23 1612  Chief Complaint(s) Tachycardia  HPI Jacqueline Barnett is a 78 y.o. female with past medical history as below, significant for DM, HLD, HTN, SVT who presents to the ED with complaint of palpitations, dyspnea, lightheaded  Patient reports earlier today she had episode of SVT, she took Toprol 25 mg which she is prescribed by cardiology.  She was having palpitations, felt lightheaded, some difficulty breathing.  On arrival she was still symptomatic but during triage she converted to sinus rhythm.  Her recent cardiology documentation back in October 2024 she was recommended for EP evaluation for ablation but she has preferred to trial medical management at this time.  She has been feeling well otherwise, she feels back to normal this time.  Past Medical History Past Medical History:  Diagnosis Date   Diabetes mellitus without complication (HCC)    Hypercholesteremia    Hypertension    Patient Active Problem List   Diagnosis Date Noted   Prolonged QT interval 09/19/2019   Acute hypoxemic respiratory failure (HCC) 09/19/2019   Pneumonia due to COVID-19 virus 09/18/2019   DM2 (diabetes mellitus, type 2) (HCC) 09/18/2019   Abnormal ECG 09/18/2019   HTN (hypertension) 09/18/2019   Elevated troponin 09/18/2019   Home Medication(s) Prior to Admission medications   Medication Sig Start Date End Date Taking? Authorizing Provider  ACCU-CHEK SMARTVIEW test strip  08/27/19   [provider]  albuterol (VENTOLIN HFA) 108 (90 Base) MCG/ACT inhaler Inhale 1 puff into the lungs every 6 (six) hours as needed for shortness of breath or wheezing. 10/19/22   [provider]  Ascorbic Acid (VITAMIN C PO) Take 1 tablet by mouth daily.    [provider]  aspirin 81 MG chewable tablet Chew by mouth daily.    [provider]  Blood Pressure  Monitoring (BLOOD PRESSURE CUFF) MISC 1 each by Does not apply route daily. 02/04/23   Gaston Islam., NP  BREO ELLIPTA 100-25 MCG/ACT AEPB Inhale 1 puff into the lungs daily. 04/14/23   [provider]  clobetasol cream (TEMOVATE) 0.05 % Apply 1 Application topically 2 (two) times daily. 06/09/22   Deirdre Evener, MD  dapagliflozin propanediol (FARXIGA) 10 MG TABS tablet Take 10 mg by mouth daily.    [provider]  famotidine (PEPCID) 20 MG tablet Take 20 mg by mouth 2 (two) times daily. 05/28/19   [provider]  fluticasone (FLONASE) 50 MCG/ACT nasal spray Place 2 sprays into both nostrils daily. 06/29/21   Cathlyn Parsons, NP  hydrochlorothiazide (HYDRODIURIL) 25 MG tablet Take 25 mg by mouth daily.    [provider]  LINZESS 145 MCG CAPS capsule Take 145 mcg by mouth daily. 10/19/22   [provider]  losartan (COZAAR) 50 MG tablet TAKE 1 TABLET(50 MG) BY MOUTH DAILY 05/04/23   Gaston Islam., NP  Magnesium Oxide -Mg Supplement 200 MG TABS Take 1 tablet (200 mg total) by mouth daily. 05/27/23   Wendall Stade, MD  metFORMIN (GLUCOPHAGE) 500 MG tablet Take by mouth 2 (two) times daily with a meal.    [provider]  metoprolol succinate (TOPROL XL) 25 MG 24 hr tablet Take 1 tablet (25 mg total) by mouth daily. Can take an additional half tab as needed 07/26/23   Gaston Islam., NP  Multiple Vitamins-Minerals (MULTIVITAMIN ADULTS 50+) TABS Take 1 tablet by  mouth daily.    [provider]  nystatin cream (MYCOSTATIN) as needed. 05/05/21   [provider]  Omega-3 Fatty Acids (FISH OIL OMEGA-3 PO) Take 1 capsule by mouth daily.    [provider]  simvastatin (ZOCOR) 40 MG tablet Take 40 mg by mouth daily at 6 PM.  10/12/16   [provider]  triamcinolone ointment (KENALOG) 0.5 % Apply 1 Application topically as needed. 12/28/22   [provider]  VITAMIN D, CHOLECALCIFEROL, PO Take 1 tablet by  mouth daily.    [provider]                                                                                                                                    Past Surgical History Past Surgical History:  Procedure Laterality Date   ABDOMINAL HYSTERECTOMY     FOOT SURGERY     Family History Family History  Problem Relation Age of Onset   Diabetes Other     Social History Social History   Tobacco Use   Smoking status: Former    Types: Cigarettes   Smokeless tobacco: Never  Substance Use Topics   Alcohol use: No   Allergies Patient has no known allergies.  Review of Systems Review of Systems  Constitutional:  Positive for fatigue. Negative for chills and fever.  Respiratory:  Positive for shortness of breath. Negative for chest tightness.   Cardiovascular:  Positive for palpitations. Negative for chest pain.  Gastrointestinal:  Negative for abdominal pain.  Genitourinary:  Negative for difficulty urinating.  Neurological:  Positive for light-headedness. Negative for speech difficulty and numbness.  All other systems reviewed and are negative.   Physical Exam Vital Signs  I have reviewed the triage vital signs BP 118/72 (BP Location: Right Arm)   Pulse 63   Temp (!) 97 F (36.1 C)   Resp 16   Ht 5\' 3"  (1.6 m)   Wt 74.4 kg   SpO2 98%   BMI 29.05 kg/m  Physical Exam Vitals and nursing note reviewed.  Constitutional:      General: She is not in acute distress.    Appearance: Normal appearance.  HENT:     Head: Normocephalic and atraumatic.     Right Ear: External ear normal.     Left Ear: External ear normal.     Nose: Nose normal.     Mouth/Throat:     Mouth: Mucous membranes are moist.  Eyes:     General: No scleral icterus.       Right eye: No discharge.        Left eye: No discharge.  Cardiovascular:     Rate and Rhythm: Normal rate and regular rhythm.     Pulses: Normal pulses.     Heart sounds: Normal heart sounds.  Pulmonary:      Effort: Pulmonary effort is normal. No respiratory distress.  Breath sounds: Normal breath sounds. No stridor.  Abdominal:     General: Abdomen is flat. There is no distension.     Palpations: Abdomen is soft.     Tenderness: There is no abdominal tenderness.  Musculoskeletal:     Cervical back: No rigidity.     Right lower leg: No edema.     Left lower leg: No edema.  Skin:    General: Skin is warm and dry.     Capillary Refill: Capillary refill takes less than 2 seconds.  Neurological:     Mental Status: She is alert.  Psychiatric:        Mood and Affect: Mood normal.        Behavior: Behavior normal. Behavior is cooperative.     ED Results and Treatments Labs (all labs ordered are listed, but only abnormal results are displayed) Labs Reviewed  BASIC METABOLIC PANEL - Abnormal; Notable for the following components:      Result Value   Glucose, Bld 174 (*)    BUN 26 (*)    Creatinine, Ser 1.42 (*)    GFR, Estimated 38 (*)    All other components within normal limits  CBC                                                                                                                          Radiology DG Chest Port 1 View Result Date: 10/21/2023 CLINICAL DATA:  5107 Atrial fibrillation (HCC) 5107 EXAM: PORTABLE CHEST 1 VIEW COMPARISON:  10/25/2019 FINDINGS: The heart size and mediastinal contours are within normal limits. Aortic atherosclerosis. Both lungs are clear. The visualized skeletal structures are unremarkable. IMPRESSION: No active disease. Electronically Signed   By: Duanne Guess D.O.   On: 10/21/2023 17:59    Pertinent labs & imaging results that were available during my care of the patient were reviewed by me and considered in my medical decision making (see MDM for details).  Medications Ordered in ED Medications  sodium chloride 0.9 % bolus 1,000 mL (0 mLs Intravenous Stopped 10/21/23 1840)                                                                                                                                      Procedures Procedures  (including critical care time)  Medical Decision Making / ED Course    Medical Decision Making:  Raeanna Mirabelli is a 78 y.o. female with past medical history as below, significant for DM, HLD, HTN, SVT who presents to the ED with complaint of palpitations, dyspnea, lightheaded. The complaint involves an extensive differential diagnosis and also carries with it a high risk of complications and morbidity.  Serious etiology was considered. Ddx includes but is not limited to: SVT, electrolyte derangement, arrhythmia, A-fib, etc.  Complete initial physical exam performed, notably the patient was in no distress, heart rate has normalized.    Reviewed and confirmed nursing documentation for past medical history, family history, social history.  Vital signs reviewed.      Clinical Course as of 10/21/23 1841  Caleen Essex Oct 21, 2023  1759 Creatinine(!): 1.42 elevated, give IV fluids.  She is tolerant p.o. without difficulty. [SG]    Clinical Course User Index [SG] Sloan Leiter, DO    Brief summary: 78 year old female with history of paroxysmal SVT on Toprol as needed here with episode of apparent SVT.  SVT resolved on arrival after oral beta-blocker.  She is feeling much better on recheck.  Will check screening labs, EKG, maintain on cardiac monitoring.  Labs reviewed as above.  Given IV fluids.  She is tolerating p.o. without difficulty.  Encourage rehydration.  She is feeling much better.  NSR on telemetry.  Favor likely paroxysmal SVT as etiology of her symptoms today.  Continue as needed Metoprolol.  Follow-up cardiology.  The patient improved significantly and was discharged in stable condition. Detailed discussions were had with the patient/guardian regarding current findings, and need for close f/u with PCP or on call doctor. The patient/guardian has been instructed to return immediately  if the symptoms worsen in any way for re-evaluation. Patient/guardian verbalized understanding and is in agreement with current care plan. All questions answered prior to discharge.                Additional history obtained: -Additional history obtained from family -External records from outside source obtained and reviewed including: Chart review including previous notes, labs, imaging, consultation notes including  Home medications, prior cardiology documentation, primary care documentation   Lab Tests: -I ordered, reviewed, and interpreted labs.   The pertinent results include:   Labs Reviewed  BASIC METABOLIC PANEL - Abnormal; Notable for the following components:      Result Value   Glucose, Bld 174 (*)    BUN 26 (*)    Creatinine, Ser 1.42 (*)    GFR, Estimated 38 (*)    All other components within normal limits  CBC    Notable for creatinine elevated  EKG   EKG Interpretation Date/Time:  Friday October 21 2023 16:33:37 EST Ventricular Rate:  72 PR Interval:  218 QRS Duration:  80 QT Interval:  393 QTC Calculation: 431 R Axis:   51  Text Interpretation: Sinus rhythm Atrial premature complexes Borderline prolonged PR interval Low voltage, precordial leads Confirmed by Tanda Rockers ([id number]) on 10/21/2023 6:40:30 PM         Imaging Studies ordered: CXR reviewed, interpreted by myself, no acute findings, agree w/ radiologist    Medicines ordered and prescription drug management: Meds ordered this encounter  Medications   sodium chloride 0.9 % bolus 1,000 mL    -I have reviewed the patients home medicines and have made adjustments as needed   Consultations Obtained: na   Cardiac Monitoring: The patient was maintained on a cardiac monitor.  I personally viewed and interpreted the cardiac monitored which showed an underlying  rhythm of: NSR Continuous pulse oximetry interpreted by myself, 97% on RA.    Social Determinants of Health:  Diagnosis or  treatment significantly limited by social determinants of health: former smoker   Reevaluation: After the interventions noted above, I reevaluated the patient and found that they have resolved  Co morbidities that complicate the patient evaluation  Past Medical History:  Diagnosis Date   Diabetes mellitus without complication (HCC)    Hypercholesteremia    Hypertension       Dispostion: Disposition decision including need for hospitalization was considered, and patient discharged from emergency department.    Final Clinical Impression(s) / ED Diagnoses Final diagnoses:  Paroxysmal SVT (supraventricular tachycardia) (HCC)  Mild dehydration        Sloan Leiter, DO 10/21/23 1841

## 2023-10-21 NOTE — Discharge Instructions (Addendum)
It was a pleasure caring for you today in the emergency department.  Your labs show some dehydration, be sure to drink plenty of fluids over the next few days.  Please return to the emergency department for any worsening or worrisome symptoms.

## 2023-10-21 NOTE — ED Triage Notes (Signed)
Onset three hours ago of fast heart rate.  Hx of Atrial fib.  States feels weak and dizzy.  States SOB

## 2023-11-08 NOTE — Telephone Encounter (Signed)
Prior Authorization for titration sent to [facility] via web portal. Tracking Number . Decision ID #: [id number] Authorization/Notification is not required for the requested service(s).

## 2023-11-19 DIAGNOSIS — I10 Essential (primary) hypertension: Secondary | ICD-10-CM | POA: Diagnosis not present

## 2023-11-19 DIAGNOSIS — E1165 Type 2 diabetes mellitus with hyperglycemia: Secondary | ICD-10-CM | POA: Diagnosis not present

## 2023-11-23 DIAGNOSIS — K5904 Chronic idiopathic constipation: Secondary | ICD-10-CM | POA: Diagnosis not present

## 2023-11-23 DIAGNOSIS — I10 Essential (primary) hypertension: Secondary | ICD-10-CM | POA: Diagnosis not present

## 2023-11-23 DIAGNOSIS — M79672 Pain in left foot: Secondary | ICD-10-CM | POA: Diagnosis not present

## 2023-11-23 DIAGNOSIS — E785 Hyperlipidemia, unspecified: Secondary | ICD-10-CM | POA: Diagnosis not present

## 2023-11-23 DIAGNOSIS — N1831 Chronic kidney disease, stage 3a: Secondary | ICD-10-CM | POA: Diagnosis not present

## 2023-11-23 DIAGNOSIS — K219 Gastro-esophageal reflux disease without esophagitis: Secondary | ICD-10-CM | POA: Diagnosis not present

## 2023-11-23 DIAGNOSIS — E1122 Type 2 diabetes mellitus with diabetic chronic kidney disease: Secondary | ICD-10-CM | POA: Diagnosis not present

## 2023-11-23 DIAGNOSIS — I5189 Other ill-defined heart diseases: Secondary | ICD-10-CM | POA: Diagnosis not present

## 2023-11-23 DIAGNOSIS — J869 Pyothorax without fistula: Secondary | ICD-10-CM | POA: Diagnosis not present

## 2023-11-23 NOTE — Progress Notes (Unsigned)
Cardiology Office Note    Patient Name: Jacqueline Barnett Date of Encounter: 11/23/2023  Primary Care Provider:  Georgann Housekeeper, MD Primary Cardiologist:  Charlton Haws, MD Primary Electrophysiologist: None   Past Medical History    Past Medical History:  Diagnosis Date   Diabetes mellitus without complication (HCC)    Hypercholesteremia    Hypertension     History of Present Illness  Jacqueline Barnett is a 78 year old female with a PMH of paroxysmal SVT, HTN, HLD, DM type II, COPD, prior tobacco abuse, who presents today for 68-month follow-up.  Jacqueline Barnett was last seen on 07/26/2023 for follow-up with improvement to palpitations with Toprol-XL.  She was noted to have elevated blood pressure but noted not taking her medication prior to visit.  She also endorsed shortness of breath and left arm pain that occurred with mild to moderate activity such as climbing stairs.  She underwent a exercise Myoview for further evaluation and to rule out possible ischemia that was normal.  She was seen in the ED on 10/21/2023 with complaint of palpitations and lightheadedness due to SVT.  She took additional as needed Toprol-XL and had conversion to sinus rhythm.  He was also given fluids and was discharged with to continue as needed Toprol-XL for palpitations.  Jacqueline Barnett presents today for post ED follow-up.  She reports feeling like she is going to pass out, which has happened about three times since her ED visit on 10/21/2023.  The patient has been taking Toprol-XL for palpitations, which has helped.  Her blood pressure today was stable at 132/82 and heart rate was 71. She has been compliant with her current medications and denies any adverse reactions. The patient also reports some discomfort in the arm and legs, which the primary doctor suggested might be arthritis.  During today's visit we also reviewed the results of her most recent stress test and all questions were answered to her  satisfaction.  She is scheduled to undergo CPAP titration next month.  Patient denies chest pain, current palpitations, dyspnea, PND, orthopnea, nausea, vomiting, dizziness, syncope, edema, weight gain, or early satiety.  Review of Systems  Please see the history of present illness.    All other systems reviewed and are otherwise negative except as noted above.  Physical Exam    Wt Readings from Last 3 Encounters:  10/21/23 164 lb (74.4 kg)  08/09/23 165 lb (74.8 kg)  07/26/23 165 lb (74.8 kg)   UY:QIHKV were no vitals filed for this visit.,There is no height or weight on file to calculate BMI. GEN: Well nourished, well developed in no acute distress Neck: No JVD; No carotid bruits Pulmonary: Clear to auscultation without rales, wheezing or rhonchi  Cardiovascular: Normal rate. Regular rhythm. Normal S1. Normal S2.   Murmurs: There is no murmur.  ABDOMEN: Soft, non-tender, non-distended EXTREMITIES:  No edema; No deformity   EKG/LABS/ Recent Cardiac Studies   ECG personally reviewed by me today -none completed today  Risk Assessment/Calculations:      STOP-Bang Score:  5      Lab Results  Component Value Date   WBC 6.3 10/21/2023   HGB 14.3 10/21/2023   HCT 42.5 10/21/2023   MCV 87.1 10/21/2023   PLT 262 10/21/2023   Lab Results  Component Value Date   CREATININE 1.42 (H) 10/21/2023   BUN 26 (H) 10/21/2023   NA 136 10/21/2023   K 3.6 10/21/2023   CL 98 10/21/2023   CO2 25 10/21/2023  Lab Results  Component Value Date   CHOL 138 09/20/2019   HDL 54 09/20/2019   LDLCALC 66 09/20/2019   TRIG 88 09/20/2019   CHOLHDL 2.6 09/20/2019    Lab Results  Component Value Date   HGBA1C 8.2 (H) 09/18/2019   Assessment & Plan    1.  Paroxysmal SVT: -Patient was seen in the ED on 10/21/2023 with apparent SVT that converted with as needed Toprol-XL -Today patient reports bouts of presyncope and palpitations. -She will continue Toprol-XL 25 mg with additional 25 mg as  needed for palpitations or tachycardia. -Patient will follow-up with Dr. Ladona Ridgel to discuss further treatment options and evaluation. -We discussed the importance of avoiding triggers such as alcohol, stress, and caffeine.  2.  Primary hypertension: -Patient's blood pressure today was controlled at 132/82 -Continue losartan 50 mg daily and Toprol-XL 25 mg daily  3.  DM type II: -Patient's last hemoglobin A1c was 7.3 -Continue current treatment plan per PCP  4.  Hyperlipidemia: -Patient's last LDL cholesterol was 36 at goal -Continue Zocor 40 mg daily  5.  Left upper arm pain: -Patient recently evaluated by PCP and will take as needed Tylenol for possible arthritic discomfort.  Disposition: Follow-up with Charlton Haws, MD or APP in 6 months    Signed, Napoleon Form, Leodis Rains, NP 11/23/2023, 8:58 AM [facility]

## 2023-11-24 ENCOUNTER — Encounter: Payer: Self-pay | Admitting: Nurse Practitioner

## 2023-11-24 ENCOUNTER — Ambulatory Visit: Payer: Medicare Other | Attending: Nurse Practitioner | Admitting: Nurse Practitioner

## 2023-11-24 VITALS — BP 132/82 | HR 71 | Ht 63.0 in | Wt 162.2 lb

## 2023-11-24 DIAGNOSIS — E1165 Type 2 diabetes mellitus with hyperglycemia: Secondary | ICD-10-CM

## 2023-11-24 DIAGNOSIS — I10 Essential (primary) hypertension: Secondary | ICD-10-CM | POA: Diagnosis not present

## 2023-11-24 DIAGNOSIS — I471 Supraventricular tachycardia, unspecified: Secondary | ICD-10-CM

## 2023-11-24 DIAGNOSIS — M79622 Pain in left upper arm: Secondary | ICD-10-CM

## 2023-11-24 DIAGNOSIS — E785 Hyperlipidemia, unspecified: Secondary | ICD-10-CM

## 2023-11-24 MED ORDER — METOPROLOL SUCCINATE ER 25 MG PO TB24: 25.0000 mg | ORAL_TABLET | Freq: Every day | ORAL | 3 refills | Status: DC

## 2023-11-24 NOTE — Patient Instructions (Signed)
Medication Instructions:  Your physician has recommended you make the following change in your medication:   CONTINUE Toprol XL 25, you may take an additional whole tablet as needed instead of 1/2 tablet  *If you need a refill on your cardiac medications before your next appointment, please call your pharmacy*   Lab Work: None ordered If you have labs (blood work) drawn today and your tests are completely normal, you will receive your results only by: MyChart Message (if you have MyChart) OR A paper copy in the mail If you have any lab test that is abnormal or we need to change your treatment, we will call you to review the results.   Testing/Procedures: None ordered    Your physician recommends that you schedule a follow-up appointment in: 1ST AVAILABLE WITH DR. Ladona Ridgel   Follow-Up: At [facility], you and your health needs are our priority.  As part of our continuing mission to provide you with exceptional heart care, we have created designated Provider Care Teams.  These Care Teams include your primary Cardiologist (physician) and Advanced Practice Providers (APPs -  Physician Assistants and Nurse Practitioners) who all work together to provide you with the care you need, when you need it.  We recommend signing up for the patient portal called "MyChart".  Sign up information is provided on this After Visit Summary.  MyChart is used to connect with patients for Virtual Visits (Telemedicine).  Patients are able to view lab/test results, encounter notes, upcoming appointments, etc.  Non-urgent messages can be sent to your provider as well.   To learn more about what you can do with MyChart, go to [email or web address].    Your next appointment:   6 month(s)  Provider:   Charlton Haws, MD     Other Instructions Need to drink at least 64 oz water daily   Need to increase your dietary intake of Potassium rich foods:  Potassium Content of Foods  Potassium is a mineral  found in many foods and drinks. It can affect how the heart works, affect blood pressure, and keep fluids and electrolytes balanced in the body. It is important not to have too much potassium (hyperkalemia) or too little potassium (hypokalemia) in the body, especially in the blood. Potassium is naturally found in many different types of whole foods, such as fruits, vegetables, meat, and dairy products. Processed foods tend to be lower in potassium. The amount of potassium you need each day depends on your age and any medical conditions you may have. General recommendations are: Females aged 34 and older: 2,600 mg per day. Males aged 69 and older: 3,400 mg per day. Talk with your health care provider or dietitian about how much potassium you need. What foods are high in potassium? Below are examples of foods that have greater than 200 mg of potassium per serving. Fruits Orange -- 1 medium (130 g) has 230 mg of potassium. Banana -- 1 medium (120 g) has 420 mg of potassium. Cantaloupe, chunks -- 1 cup (160 g) has 430 mg of potassium. Vegetables Potato, baked, without skin -- 1 medium (170 g) has 600 mg of potassium. Broccoli, chopped, cooked --  cup (77.5 g) has 230 mg of potassium. Tomato, chopped or sliced -- 1 cup (152 g) has 400 mg of potassium. Grains Cereal, bran with raisins -- 1 cup (59 g) has 360 mg of potassium. Granola with almonds --  cup (82 g) has 220 mg of potassium. Meats and other proteins Ground beef  patty -- 4 ounces (113 g) has 240 mg of potassium. Kidney beans, boiled --  cup (130 g) has 350 mg of potassium. Almonds -- 1 ounce (approximately 22 nuts or 28 g) has 200 mg of potassium. Dairy Cow's milk, 1% -- 1 cup (237 mL) has 360 mg of potassium. Plain vanilla low-fat yogurt --  cup (184 g) has 220 mg of potassium. The items listed above may not be a complete list of foods high in potassium. Actual amounts of potassium may be different depending on ripeness, shelf life,  and food preparation. Contact a dietitian for more information. What foods are low in potassium? Below are examples of foods that have less than 200 mg of potassium per serving. Fruits Blueberries -- 1 cup (145 g) has 110 mg of potassium. Apple -- 1 medium (140 g) has 145 mg of potassium. Grapes -- 1 cup (160 g) has 175 mg of potassium. Vegetables Cabbage, raw -- 1 cup (70 g) has 120 mg of potassium. Cauliflower, chopped, cooked -- 1 cup (180 g) has 90 mg of potassium. Romaine lettuce, chopped -- 1 cup (56 g) has 120 mg of potassium. Grains Bagel, plain -- one 4-inch (10 cm) has 100 mg of potassium. Whole wheat bread -- 1 slice (26 g) has 70 mg of potassium. White rice, cooked -- 1 cup (163 g) has 50 mg of potassium. Meats and other proteins Tuna, light, canned in water -- 3 ounces (85 g) has 150 mg of potassium. Egg, fried -- 1 large (50 g) has 60 mg of potassium. Peanuts --1 ounce (35 nuts or 28 g) has 180 mg of potassium. Tofu --  cup (252 g) has 150 mg of potassium. Dairy Cheese (cheddar, colby, mozzarella, or provolone) -- 1 ounce (28 g) has 30 to 40 mg of potassium. The items listed above may not be a complete list of foods that are low in potassium. Actual amounts of potassium may be different depending on ripeness, shelf life, and food preparation. Contact a dietitian for more information. Summary Potassium is a mineral found in many foods and drinks. It affects how the heart works, affects blood pressure, and keeps fluids and electrolytes balanced in the body. The amount of potassium you need each day depends on your age and any existing medical conditions you may have. Your health care provider or dietitian may recommend an amount of potassium that you should have each day. This information is not intended to replace advice given to you by your health care provider. Make sure you discuss any questions you have with your health care provider. Document Revised: 06/23/2021  Document Reviewed: 06/04/2021 Elsevier Patient Education  2024 Elsevier Inc.     DASH Eating Plan DASH stands for Dietary Approaches to Stop Hypertension. The DASH eating plan is a healthy eating plan that has been shown to: Lower high blood pressure (hypertension). Reduce your risk for type 2 diabetes, heart disease, and stroke. Help with weight loss. What are tips for following this plan? Reading food labels Check food labels for the amount of salt (sodium) per serving. Choose foods with less than 5 percent of the Daily Value (DV) of sodium. In general, foods with less than 300 milligrams (mg) of sodium per serving fit into this eating plan. To find whole grains, look for the word "whole" as the first word in the ingredient list. Shopping Buy products labeled as "low-sodium" or "no salt added." Buy fresh foods. Avoid canned foods and pre-made or frozen meals. Cooking  Try not to add salt when you cook. Use salt-free seasonings or herbs instead of table salt or sea salt. Check with your health care provider or pharmacist before using salt substitutes. Do not fry foods. Cook foods in healthy ways, such as baking, boiling, grilling, roasting, or broiling. Cook using oils that are good for your heart. These include olive, canola, avocado, soybean, and sunflower oil. Meal planning  Eat a balanced diet. This should include: 4 or more servings of fruits and 4 or more servings of vegetables each day. Try to fill half of your plate with fruits and vegetables. 6-8 servings of whole grains each day. 6 or less servings of lean meat, poultry, or fish each day. 1 oz is 1 serving. A 3 oz (85 g) serving of meat is about the same size as the palm of your hand. One egg is 1 oz (28 g). 2-3 servings of low-fat dairy each day. One serving is 1 cup (237 mL). 1 serving of nuts, seeds, or beans 5 times each week. 2-3 servings of heart-healthy fats. Healthy fats called omega-3 fatty acids are found in foods such  as walnuts, flaxseeds, fortified milks, and eggs. These fats are also found in cold-water fish, such as sardines, salmon, and mackerel. Limit how much you eat of: Canned or prepackaged foods. Food that is high in trans fat, such as fried foods. Food that is high in saturated fat, such as fatty meat. Desserts and other sweets, sugary drinks, and other foods with added sugar. Full-fat dairy products. Do not salt foods before eating. Do not eat more than 4 egg yolks a week. Try to eat at least 2 vegetarian meals a week. Eat more home-cooked food and less restaurant, buffet, and fast food. Lifestyle When eating at a restaurant, ask if your food can be made with less salt or no salt. If you drink alcohol: Limit how much you have to: 0-1 drink a day if you are female. 0-2 drinks a day if you are female. Know how much alcohol is in your drink. In the [place] one drink is one 12 oz bottle of beer (355 mL), one 5 oz glass of wine (148 mL), or one 1 oz glass of hard liquor (44 mL). General information Avoid eating more than 2,300 mg of salt a day. If you have hypertension, you may need to reduce your sodium intake to 1,500 mg a day. Work with your provider to stay at a healthy body weight or lose weight. Ask what the best weight range is for you. On most days of the week, get at least 30 minutes of exercise that causes your heart to beat faster. This may include walking, swimming, or biking. Work with your provider or dietitian to adjust your eating plan to meet your specific calorie needs. What foods should I eat? Fruits All fresh, dried, or frozen fruit. Canned fruits that are in their natural juice and do not have sugar added to them. Vegetables Fresh or frozen vegetables that are raw, steamed, roasted, or grilled. Low-sodium or reduced-sodium tomato and vegetable juice. Low-sodium or reduced-sodium tomato sauce and tomato paste. Low-sodium or reduced-sodium canned vegetables. Grains Whole-grain  or whole-wheat bread. Whole-grain or whole-wheat pasta. Brown rice. Orpah Cobb. Bulgur. Whole-grain and low-sodium cereals. Pita bread. Low-fat, low-sodium crackers. Whole-wheat flour tortillas. Meats and other proteins Skinless chicken or [place]. Ground chicken or [place]. Pork with fat trimmed off. Fish and seafood. Egg whites. Dried beans, peas, or lentils. Unsalted nuts, nut butters, and  seeds. Unsalted canned beans. Lean cuts of beef with fat trimmed off. Low-sodium, lean precooked or cured meat, such as sausages or meat loaves. Dairy Low-fat (1%) or fat-free (skim) milk. Reduced-fat, low-fat, or fat-free cheeses. Nonfat, low-sodium ricotta or cottage cheese. Low-fat or nonfat yogurt. Low-fat, low-sodium cheese. Fats and oils Soft margarine without trans fats. Vegetable oil. Reduced-fat, low-fat, or light mayonnaise and salad dressings (reduced-sodium). Canola, safflower, olive, avocado, soybean, and sunflower oils. Avocado. Seasonings and condiments Herbs. Spices. Seasoning mixes without salt. Other foods Unsalted popcorn and pretzels. Fat-free sweets. The items listed above may not be all the foods and drinks you can have. Talk to a dietitian to learn more. What foods should I avoid? Fruits Canned fruit in a light or heavy syrup. Fried fruit. Fruit in cream or butter sauce. Vegetables Creamed or fried vegetables. Vegetables in a cheese sauce. Regular canned vegetables that are not marked as low-sodium or reduced-sodium. Regular canned tomato sauce and paste that are not marked as low-sodium or reduced-sodium. Regular tomato and vegetable juices that are not marked as low-sodium or reduced-sodium. Rosita Fire. Olives. Grains Baked goods made with fat, such as croissants, muffins, or some breads. Dry pasta or rice meal packs. Meats and other proteins Fatty cuts of meat. Ribs. Fried meat. Tomasa Blase. Bologna, salami, and other precooked or cured meats, such as sausages or meat loaves, that are not  lean and low in sodium. Fat from the back of a pig (fatback). Bratwurst. Salted nuts and seeds. Canned beans with added salt. Canned or smoked fish. Whole eggs or egg yolks. Chicken or [place] with skin. Dairy Whole or 2% milk, cream, and half-and-half. Whole or full-fat cream cheese. Whole-fat or sweetened yogurt. Full-fat cheese. Nondairy creamers. Whipped toppings. Processed cheese and cheese spreads. Fats and oils Butter. Stick margarine. Lard. Shortening. Ghee. Bacon fat. Tropical oils, such as coconut, palm kernel, or palm oil. Seasonings and condiments Onion salt, garlic salt, seasoned salt, table salt, and sea salt. Worcestershire sauce. Tartar sauce. Barbecue sauce. Teriyaki sauce. Soy sauce, including reduced-sodium soy sauce. Steak sauce. Canned and packaged gravies. Fish sauce. Oyster sauce. Cocktail sauce. Store-bought horseradish. Ketchup. Mustard. Meat flavorings and tenderizers. Bouillon cubes. Hot sauces. Pre-made or packaged marinades. Pre-made or packaged taco seasonings. Relishes. Regular salad dressings. Other foods Salted popcorn and pretzels. The items listed above may not be all the foods and drinks you should avoid. Talk to a dietitian to learn more. Where to find more information National Heart, Lung, and Blood Institute (NHLBI): BuffaloDryCleaner.gl American Heart Association (AHA): heart.org Academy of Nutrition and Dietetics: eatright.org National Kidney Foundation (NKF): kidney.org This information is not intended to replace advice given to you by your health care provider. Make sure you discuss any questions you have with your health care provider. Document Revised: 10/07/2022 Document Reviewed: 10/07/2022 Elsevier Patient Education  2024 Elsevier Inc.     1st Floor: - Lobby - Registration  - Pharmacy  - Lab - Cafe  2nd Floor: - PV Lab - Diagnostic Testing (echo, CT, nuclear med)  3rd Floor: - Vacant  4th Floor: - TCTS (cardiothoracic surgery) - AFib  Clinic - Structural Heart Clinic - Vascular Surgery  - Vascular Ultrasound  5th Floor: - [facility] Cardiology (general and EP) - Clinical Pharmacy for coumadin, hypertension, lipid, weight-loss medications, and med management appointments    Valet parking services will be available as well.

## 2023-12-02 DIAGNOSIS — I10 Essential (primary) hypertension: Secondary | ICD-10-CM | POA: Diagnosis not present

## 2023-12-02 DIAGNOSIS — E1165 Type 2 diabetes mellitus with hyperglycemia: Secondary | ICD-10-CM | POA: Diagnosis not present

## 2023-12-14 DIAGNOSIS — I10 Essential (primary) hypertension: Secondary | ICD-10-CM | POA: Diagnosis not present

## 2023-12-14 DIAGNOSIS — E1165 Type 2 diabetes mellitus with hyperglycemia: Secondary | ICD-10-CM | POA: Diagnosis not present

## 2023-12-19 DIAGNOSIS — E1165 Type 2 diabetes mellitus with hyperglycemia: Secondary | ICD-10-CM | POA: Diagnosis not present

## 2023-12-19 DIAGNOSIS — I10 Essential (primary) hypertension: Secondary | ICD-10-CM | POA: Diagnosis not present

## 2023-12-28 ENCOUNTER — Ambulatory Visit (HOSPITAL_BASED_OUTPATIENT_CLINIC_OR_DEPARTMENT_OTHER): Payer: HMO | Attending: Cardiology | Admitting: Cardiology

## 2023-12-28 DIAGNOSIS — I10 Essential (primary) hypertension: Secondary | ICD-10-CM

## 2023-12-28 DIAGNOSIS — G4733 Obstructive sleep apnea (adult) (pediatric): Secondary | ICD-10-CM | POA: Diagnosis not present

## 2023-12-29 DIAGNOSIS — K5900 Constipation, unspecified: Secondary | ICD-10-CM | POA: Diagnosis not present

## 2023-12-29 DIAGNOSIS — I471 Supraventricular tachycardia, unspecified: Secondary | ICD-10-CM | POA: Diagnosis not present

## 2023-12-29 DIAGNOSIS — Z860101 Personal history of adenomatous and serrated colon polyps: Secondary | ICD-10-CM | POA: Diagnosis not present

## 2024-01-02 DIAGNOSIS — I10 Essential (primary) hypertension: Secondary | ICD-10-CM | POA: Diagnosis not present

## 2024-01-02 DIAGNOSIS — E1165 Type 2 diabetes mellitus with hyperglycemia: Secondary | ICD-10-CM | POA: Diagnosis not present

## 2024-01-02 NOTE — Procedures (Signed)
   [facility] [address] [place], [place] [address] Tel: [phone]   Fax: [phone]  Titration Interpretation  Patient Name:  Jacqueline Barnett, Jacqueline Barnett Date:  12/28/2023 Referring Physician:  Dr. Armanda Magic  Indications for Polysomnography The patient is a 78 year old Female who is 5\' 2"  and weighs 163.0 lbs. Her BMI equals 30.0.  A full night titration treatment study was performed.  Medication  No medications were reported taken during the night..   Polysomnogram Data A full night polysomnogram recorded the standard physiologic parameters including EEG, EOG, EMG, EKG, nasal and oral airflow.  Respiratory parameters of chest and abdominal movements were recorded with Respiratory Inductance Plethysmography belts.  Oxygen saturation was recorded by pulse oximetry.   Sleep Architecture The total recording time of the polysomnogram was 482.4 minutes.  The total sleep time was 194.0 minutes.  The patient spent 16.5% of total sleep time in Stage N1, 65.2% in Stage N2, 4.1% in Stages N3, and 14.2% in REM.  Sleep latency was 5.8 minutes.  REM latency was 59.0 minutes.  Sleep Efficiency was 40.2%.  Wake after Sleep Onset time was 282.5 minutes.  Titration Summary The patient was titrated at pressures ranging from 6 cm/H20 up to 20/16 cm/H20.  The last pressure used in the study was 20/16 cm/H20.  Respiratory Events The polysomnogram revealed a presence of 4 obstructive, 16 central, and 7 mixed apneas resulting in an Apnea index of 8.4 events per hour.  There were 73 hypopneas (>=3% desaturation and/or arousal) resulting in an Apnea\Hypopnea Index (AHI >=3% desaturation and/or arousal) of 30.9 events per hour.  There were 50 hypopneas (>=4% desaturation) resulting in an Apnea\Hypopnea Index (AHI >=4% desaturation) of 23.8 events per hour.  There were 24 Respiratory Effort Related Arousals resulting in a RERA index of 7.4 events per hour. The Respiratory  Disturbance Index is 38.4 events per hour.  The snore index was 67.7 events per hour.  Mean oxygen saturation was 97.0%.  The lowest oxygen saturation during sleep was 86.0%.  Time spent <=88% oxygen saturation was 1.9 minutes (0.4%).  Limb Activity There were no limb movements recorded.    Cardiac Summary The average pulse rate was 59.0 bpm.  The minimum pulse rate was 40.0 bpm while the maximum pulse rate was 79.0 bpm.  Cardiac rhythm was normal..  Diagnosis:   Recommendations: Recommend ResMed BiPAP at 20/16cm H2O, heated humidity and small FISHER & PAYKEL SIMPLUS FULL FACE MASK The patient should be counseled on good sleep hygiene and avoid sleeping supine. The patient should be instructed not to drive if sleepy. Followup in Sleep Medicine Clinic in 6 weeks.   This study was personally reviewed and electronically signed by: Dr. Armanda Magic Accredited Board Certified in Sleep Medicine Date/Time: 01/02/2024 7:40PM

## 2024-01-03 DIAGNOSIS — H903 Sensorineural hearing loss, bilateral: Secondary | ICD-10-CM | POA: Diagnosis not present

## 2024-01-10 ENCOUNTER — Ambulatory Visit: Payer: Medicare Other | Admitting: Internal Medicine

## 2024-01-10 ENCOUNTER — Telehealth: Payer: Self-pay

## 2024-01-10 NOTE — Telephone Encounter (Signed)
 Notified patient of PAP Titration results and recommendations via VM, per DPR. Order for PAP device and supplies sent to [facility] today.

## 2024-01-10 NOTE — Telephone Encounter (Signed)
-----   Message from Armanda Magic sent at 01/02/2024  7:41 PM EDT ----- Please let patient know that they had a successful PAP titration and let DME know that orders are in EPIC.  Please set up 6 week OV with me.

## 2024-01-20 ENCOUNTER — Encounter: Payer: Self-pay | Admitting: Internal Medicine

## 2024-01-20 ENCOUNTER — Ambulatory Visit: Attending: Cardiology | Admitting: Internal Medicine

## 2024-01-20 VITALS — BP 146/78 | HR 76 | Ht 63.0 in | Wt 157.0 lb

## 2024-01-20 DIAGNOSIS — I471 Supraventricular tachycardia, unspecified: Secondary | ICD-10-CM

## 2024-01-20 NOTE — Progress Notes (Signed)
 HPI Jacqueline Barnett is referred today by Dr. Stann Earnest for evaluation of SVT. 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 She had been started on toprol . She has not had syncope. No chest pain. She has longstanding HTN. Since I saw her last she notes that her symptoms are better. Her main complaint is fatigue and difficulty sleeping. She has known sleep apnea but is not yet wearing a CPAP as she has some compliance issues. Her symptoms of SVT did improve on the toprol .  No Known Allergies   Current Outpatient Medications  Medication Sig Dispense Refill   ACCU-CHEK SMARTVIEW test strip      albuterol  (VENTOLIN  HFA) 108 (90 Base) MCG/ACT inhaler Inhale 1 puff into the lungs every 6 (six) hours as needed for shortness of breath or wheezing.     aspirin  81 MG chewable tablet Chew by mouth daily.     Blood Pressure Monitoring (BLOOD PRESSURE CUFF) MISC 1 each by Does not apply route daily. 1 each 0   BREO ELLIPTA 100-25 MCG/ACT AEPB Inhale 1 puff into the lungs daily.     dapagliflozin propanediol (FARXIGA) 10 MG TABS tablet Take 10 mg by mouth daily.     famotidine (PEPCID) 20 MG tablet Take 20 mg by mouth 2 (two) times daily.     fluticasone  (FLONASE ) 50 MCG/ACT nasal spray Place 2 sprays into both nostrils daily. 16 g 2   hydrochlorothiazide (HYDRODIURIL) 25 MG tablet Take 25 mg by mouth daily.     LINZESS 145 MCG CAPS capsule Take 145 mcg by mouth daily.     losartan  (COZAAR ) 100 MG tablet Take 1 tablet by mouth daily.     metFORMIN (GLUCOPHAGE) 500 MG tablet Take by mouth 2 (two) times daily with a meal.     metoprolol  succinate (TOPROL  XL) 25 MG 24 hr tablet Take 1 tablet (25 mg total) by mouth daily. Can take an additional tabLET  as needed 140 tablet 3   Multiple Vitamins-Minerals (MULTIVITAMIN ADULTS 50+) TABS Take 1 tablet by mouth daily.     nystatin cream (MYCOSTATIN) as needed.     Omega-3 Fatty Acids (FISH  OIL OMEGA-3 PO) Take 1 capsule by mouth daily.     simvastatin  (ZOCOR ) 40 MG tablet Take 40 mg by mouth daily at 6 PM.      triamcinolone ointment (KENALOG) 0.5 % Apply 1 Application topically as needed.     losartan  (COZAAR ) 50 MG tablet TAKE 1 TABLET(50 MG) BY MOUTH DAILY (Patient not taking: Reported on 01/20/2024) 90 tablet 2   No current facility-administered medications for this visit.     Past Medical History:  Diagnosis Date   Diabetes mellitus without complication (HCC)    Hypercholesteremia    Hypertension     ROS:   All systems reviewed and negative except as noted in the HPI.   Past Surgical History:  Procedure Laterality Date   ABDOMINAL HYSTERECTOMY     FOOT SURGERY       Family History  Problem Relation Age of Onset   Diabetes Other      Social History   Socioeconomic History   Marital status: Widowed    Spouse name: Not on file   Number of children: Not on file   Years of education: Not on file   Highest education level: Not on file  Occupational History   Not on file  Tobacco  Use   Smoking status: Former    Types: Cigarettes   Smokeless tobacco: Never  Substance and Sexual Activity   Alcohol use: No   Drug use: Not on file   Sexual activity: Not on file  Other Topics Concern   Not on file  Social History Narrative   Not on file   Social Drivers of Health   Financial Resource Strain: Not on file  Food Insecurity: Not on file  Transportation Needs: Not on file  Physical Activity: Not on file  Stress: Not on file  Social Connections: Not on file  Intimate Partner Violence: Not on file     BP (!) 146/78   Pulse 76   Ht 5\' 3"  (1.6 m)   Wt 157 lb (71.2 kg)   SpO2 97%   BMI 27.81 kg/m   Physical Exam:  Well appearing NAD HEENT: Unremarkable Neck:  No JVD, no thyromegally Lymphatics:  No adenopathy Back:  No CVA tenderness Lungs:  Clear HEART:  Regular rate rhythm, no murmurs, no rubs, no clicks Abd:  soft, positive bowel  sounds, no organomegally, no rebound, no guarding Ext:  2 plus pulses, no edema, no cyanosis, no clubbing Skin:  No rashes no nodules Neuro:  CN II through XII intact, motor grossly intact   Assess/Plan: Recurrent SVT - I have discussed the treatment options with the patient and her daughter. She has been on toprol . She would like continue the toprol  as her symptoms are much better.  Sleep apnea - I encouraged her to reach out to Dr. Micael Adas.    Pete Brand Shivali Quackenbush,MD

## 2024-01-20 NOTE — Patient Instructions (Addendum)
 Medication Instructions:  Your physician recommends that you continue on your current medications as directed. Please refer to the Current Medication list given to you today.  *If you need a refill on your cardiac medications before your next appointment, please call your pharmacy*  Lab Work: None ordered.  You may go to any [facility] Location for your lab work:  [facility] - [address] ([facility]) - [address] [phone]  [place] - [address]   [place]  - [address]   [place] - [address] - [address] [facility] [facility]  - [address] - [address] ([facility]   If you have labs (blood work) drawn today and your tests are completely normal, you will receive your results only by: [employer] (if you have MyChart)  If you have any lab test that is abnormal or we need to change your treatment, we will call you or send a MyChart message to review the results.  Testing/Procedures: None ordered.  Follow-Up: At [facility], you and your health needs are our priority.  As part of our continuing mission to provide you with exceptional heart care, we have created designated Provider Care Teams.  These Care Teams include your primary Cardiologist (physician) and Advanced Practice Providers (APPs -  Physician Assistants and Nurse Practitioners) who all work together to provide you with the care you need, when you need it.  Your next appointment:   As needed  The format for your next appointment:   In Person  Provider:   Danelle Birmingham, MD{or one of the following Advanced Practice Providers on your designated Care Team:   Charlies Arthur, [place] Ozell Jodie Passey, [place] Leotis Barrack, NP             Valet parking services will be available as well.

## 2024-01-26 ENCOUNTER — Emergency Department (HOSPITAL_BASED_OUTPATIENT_CLINIC_OR_DEPARTMENT_OTHER): Admitting: Radiology

## 2024-01-26 ENCOUNTER — Emergency Department (HOSPITAL_BASED_OUTPATIENT_CLINIC_OR_DEPARTMENT_OTHER)
Admission: EM | Admit: 2024-01-26 | Discharge: 2024-01-26 | Disposition: A | Discharge status: Home / Self Care | Attending: Emergency Medicine | Admitting: Emergency Medicine

## 2024-01-26 ENCOUNTER — Encounter (HOSPITAL_BASED_OUTPATIENT_CLINIC_OR_DEPARTMENT_OTHER): Payer: Self-pay | Admitting: Emergency Medicine

## 2024-01-26 ENCOUNTER — Other Ambulatory Visit: Payer: Self-pay

## 2024-01-26 DIAGNOSIS — E119 Type 2 diabetes mellitus without complications: Secondary | ICD-10-CM | POA: Insufficient documentation

## 2024-01-26 DIAGNOSIS — J168 Pneumonia due to other specified infectious organisms: Secondary | ICD-10-CM | POA: Insufficient documentation

## 2024-01-26 DIAGNOSIS — R5383 Other fatigue: Secondary | ICD-10-CM | POA: Diagnosis not present

## 2024-01-26 DIAGNOSIS — Z79899 Other long term (current) drug therapy: Secondary | ICD-10-CM | POA: Diagnosis not present

## 2024-01-26 DIAGNOSIS — Z7984 Long term (current) use of oral hypoglycemic drugs: Secondary | ICD-10-CM | POA: Diagnosis not present

## 2024-01-26 DIAGNOSIS — I10 Essential (primary) hypertension: Secondary | ICD-10-CM | POA: Insufficient documentation

## 2024-01-26 DIAGNOSIS — J181 Lobar pneumonia, unspecified organism: Secondary | ICD-10-CM | POA: Diagnosis not present

## 2024-01-26 DIAGNOSIS — Z7982 Long term (current) use of aspirin: Secondary | ICD-10-CM | POA: Insufficient documentation

## 2024-01-26 DIAGNOSIS — J189 Pneumonia, unspecified organism: Secondary | ICD-10-CM

## 2024-01-26 DIAGNOSIS — R918 Other nonspecific abnormal finding of lung field: Secondary | ICD-10-CM | POA: Diagnosis not present

## 2024-01-26 DIAGNOSIS — R059 Cough, unspecified: Secondary | ICD-10-CM | POA: Diagnosis not present

## 2024-01-26 LAB — RESP PANEL BY RT-PCR (RSV, FLU A&B, COVID)  RVPGX2
Influenza A by PCR: NEGATIVE
Influenza B by PCR: NEGATIVE
Resp Syncytial Virus by PCR: NEGATIVE
SARS Coronavirus 2 by RT PCR: NEGATIVE

## 2024-01-26 MED ORDER — ACETAMINOPHEN 500 MG PO TABS
1000.0000 mg | ORAL_TABLET | Freq: Once | ORAL | Status: AC
  Administered 2024-01-26: 1000 mg via ORAL
  Filled 2024-01-26: qty 2

## 2024-01-26 MED ORDER — DOXYCYCLINE HYCLATE 100 MG PO CAPS: 100.0000 mg | ORAL_CAPSULE | Freq: Two times a day (BID) | ORAL | 0 refills | Status: DC

## 2024-01-26 MED ORDER — DOXYCYCLINE HYCLATE 100 MG PO TABS
100.0000 mg | ORAL_TABLET | Freq: Once | ORAL | Status: AC
  Administered 2024-01-26: 100 mg via ORAL
  Filled 2024-01-26: qty 1

## 2024-01-26 NOTE — ED Provider Notes (Signed)
 [place] EMERGENCY DEPARTMENT AT [facility] Provider Note   CSN: [id number] Arrival date & time: 01/26/24  1603     History  Chief Complaint  Patient presents with   Generalized Body Aches   Fatigue   Cough    Jacqueline Barnett is a 78 y.o. female with history of diabetes, hypertension, presents with concern for body aches, chills, cough, and fatigue that started 2 days ago.  She notes the cough is a dry cough, nonproductive.  Also notes decreased appetite but denies any vomiting, diarrhea, or abdominal pain.   Cough      Home Medications Prior to Admission medications   Medication Sig Start Date End Date Taking? Authorizing Provider  doxycycline  (VIBRAMYCIN ) 100 MG capsule Take 1 capsule (100 mg total) by mouth 2 (two) times daily. 01/27/24  Yes Rexie Catena, PA-C  ACCU-CHEK SMARTVIEW test strip  08/27/19   [provider]  albuterol  (VENTOLIN  HFA) 108 (90 Base) MCG/ACT inhaler Inhale 1 puff into the lungs every 6 (six) hours as needed for shortness of breath or wheezing. 10/19/22   [provider]  aspirin  81 MG chewable tablet Chew by mouth daily.    [provider]  Blood Pressure Monitoring (BLOOD PRESSURE CUFF) MISC 1 each by Does not apply route daily. 02/04/23   Gerald Kitty., NP  BREO ELLIPTA 100-25 MCG/ACT AEPB Inhale 1 puff into the lungs daily. 04/14/23   [provider]  dapagliflozin propanediol (FARXIGA) 10 MG TABS tablet Take 10 mg by mouth daily.    [provider]  famotidine (PEPCID) 20 MG tablet Take 20 mg by mouth 2 (two) times daily. 05/28/19   [provider]  fluticasone  (FLONASE ) 50 MCG/ACT nasal spray Place 2 sprays into both nostrils daily. 06/29/21   Blinda Burger, NP  hydrochlorothiazide (HYDRODIURIL) 25 MG tablet Take 25 mg by mouth daily.    [provider]  LINZESS 145 MCG CAPS capsule Take 145 mcg by mouth daily. 10/19/22   [provider]  losartan   (COZAAR ) 100 MG tablet Take 1 tablet by mouth daily.    [provider]  losartan  (COZAAR ) 50 MG tablet TAKE 1 TABLET(50 MG) BY MOUTH DAILY Patient not taking: Reported on 01/20/2024 05/04/23   Dick, Ernest H Jr., NP  metFORMIN (GLUCOPHAGE) 500 MG tablet Take by mouth 2 (two) times daily with a meal.    [provider]  metoprolol  succinate (TOPROL  XL) 25 MG 24 hr tablet Take 1 tablet (25 mg total) by mouth daily. Can take an additional tabLET  as needed 11/24/23   Dick, Ernest H Jr., NP  Multiple Vitamins-Minerals (MULTIVITAMIN ADULTS 50+) TABS Take 1 tablet by mouth daily.    [provider]  nystatin cream (MYCOSTATIN) as needed. 05/05/21   [provider]  Omega-3 Fatty Acids (FISH OIL OMEGA-3 PO) Take 1 capsule by mouth daily.    [provider]  simvastatin  (ZOCOR ) 40 MG tablet Take 40 mg by mouth daily at 6 PM.  10/12/16   [provider]  triamcinolone ointment (KENALOG) 0.5 % Apply 1 Application topically as needed. 12/28/22   [provider]      Allergies    Patient has no known allergies.    Review of Systems   Review of Systems  Respiratory:  Positive for cough.     Physical Exam Updated Vital Signs BP 136/69   Pulse 81   Temp 99.2 F (37.3 C)   Resp 17  Ht 5\' 3"  (1.6 m)   Wt 71.2 kg   SpO2 91%   BMI 27.81 kg/m  Physical Exam Vitals and nursing note reviewed.  Constitutional:      General: She is not in acute distress.    Appearance: She is well-developed.  HENT:     Head: Normocephalic and atraumatic.     Mouth/Throat:     Mouth: Mucous membranes are moist.     Pharynx: No oropharyngeal exudate or posterior oropharyngeal erythema.  Eyes:     Conjunctiva/sclera: Conjunctivae normal.  Cardiovascular:     Rate and Rhythm: Normal rate and regular rhythm.     Heart sounds: No murmur heard. Pulmonary:     Effort: Pulmonary effort is normal. No respiratory distress.     Comments: Crackles diffusely  throughout lung fields Abdominal:     Palpations: Abdomen is soft.     Tenderness: There is no abdominal tenderness.  Musculoskeletal:        General: No swelling.     Cervical back: Neck supple.  Skin:    General: Skin is warm and dry.     Capillary Refill: Capillary refill takes less than 2 seconds.  Neurological:     Mental Status: She is alert.  Psychiatric:        Mood and Affect: Mood normal.     ED Results / Procedures / Treatments   Labs (all labs ordered are listed, but only abnormal results are displayed) Labs Reviewed  RESP PANEL BY RT-PCR (RSV, FLU A&B, COVID)  [id number]    EKG None  Radiology DG Chest 2 View Result Date: 01/26/2024 CLINICAL DATA:  Cough, generalized body aches and fatigue x2 days. EXAM: CHEST - 2 VIEW COMPARISON:  October 21, 2023 FINDINGS: The heart size and mediastinal contours are within normal limits. There is marked severity calcification of the aortic arch. Low lung volumes are noted with very mild left basilar atelectasis and/or infiltrate. There is no evidence of focal consolidation, pleural effusion or pneumothorax. The visualized skeletal structures are unremarkable. IMPRESSION: Low lung volumes with very mild left basilar atelectasis and/or infiltrate. Electronically Signed   By: Virgle Grime M.D.   On: 01/26/2024 19:07    Procedures Procedures    Medications Ordered in ED Medications  doxycycline  (VIBRA -TABS) tablet 100 mg (has no administration in time range)  acetaminophen  (TYLENOL ) tablet 1,000 mg (has no administration in time range)    ED Course/ Medical Decision Making/ A&P                                 Medical Decision Making Amount and/or Complexity of Data Reviewed Radiology: ordered.     Differential diagnosis includes but is not limited to COVID, flu, RSV, viral URI, strep pharyngitis, viral pharyngitis, allergic rhinitis, pneumonia, bronchitis   ED Course:  Upon initial evaluation, patient is  well-appearing, stable vital signs.  Reporting URI like symptoms for the past 2 days.  Lungs with crackles bilaterally.  She is able to tolerate p.o. intake, but just has decreased appetite.  Abdomen is soft nontender.  COVID flu, and RSV testing are negative.  Chest x-ray shows consolidation in the left lower lobe that is concerning for pneumonia given her symptoms.  Will treat with 5-day course of doxycycline . Case discussed with my attending Dr. Urban Garden who is in agreement with plan of care  Labs Ordered: I Ordered, and personally interpreted labs.  The pertinent results  include:   COVID, flu, RSV negative  Imaging Studies ordered: I ordered imaging studies including chest x-ray I independently visualized the imaging with scope of interpretation limited to determining acute life threatening conditions related to emergency care. Imaging showed consolidation left lower lobe I agree with the radiologist interpretation   Medications Given: Doxycycline  for pneumonia Tylenol  for pain   Impression: Pneumonia  Disposition:  The patient was discharged home with instructions to take 5-day course of doxycycline  as prescribed.  Follow-up with PCP if symptoms not improved by the end of the antibiotic course.  May take Tylenol  and ibuprofen as needed for fever and pain at home. Return precautions given.    This chart was dictated using voice recognition software, Dragon. Despite the best efforts of this provider to proofread and correct errors, errors may still occur which can change documentation meaning.          Final Clinical Impression(s) / ED Diagnoses Final diagnoses:  Pneumonia of left lower lobe due to infectious organism    Rx / DC Orders ED Discharge Orders          Ordered    doxycycline  (VIBRAMYCIN ) 100 MG capsule  2 times daily        01/26/24 1953              Rexie Catena, PA-C 01/26/24 Genetta Kenning, MD 01/26/24 2104

## 2024-01-26 NOTE — Discharge Instructions (Addendum)
 Your chest x-ray showed that you have a pneumonia in your left lobe.  You have been prescribed doxycycline . Take this antibiotic 2 times a day for the next 5 days. Take the full course of your antibiotic even if you start feeling better. Antibiotics may cause you to have diarrhea.   Your flu, covid, and RSV test were negative today    Home care instructions:  You can take Tylenol  and/or Ibuprofen as directed on the packaging for fever reduction and pain relief. You were given your first dose of tylenol  here   For cough: honey 1/2 to 1 teaspoon (you can dilute the honey in water or another fluid).  You can also use guaifenesin  and dextromethorphan for cough which are over-the-counter medications. You can use a humidifier for chest congestion and cough.  If you don't have a humidifier, you can sit in the bathroom with the hot shower running.      For sore throat: try warm salt water gargles, cepacol lozenges, throat spray, warm tea or water with lemon/honey, popsicles or ice, or OTC cold relief medicine for throat discomfort.    For congestion: Flonase  (Fluticasone ) 1-2 sprays in each nostril daily. This is an over the counter medication.    It is important to stay hydrated: drink plenty of fluids (water, gatorade/powerade/pedialyte, juices, or teas) to keep your throat moisturized and help further relieve irritation/discomfort.   Follow-up instructions: Please follow-up with your primary care provider for further evaluation of your symptoms if you are not feeling better within the next 5 days.   Return instructions:  Please return to the Emergency Department if you experience worsening symptoms.  RETURN IMMEDIATELY IF you develop shortness of breath, confusion or altered mental status, a new rash, become dizzy, faint, or poorly responsive, or are unable to be cared for at home. Please return if you have persistent vomiting and cannot keep down fluids or develop a fever that is not controlled  by tylenol  or motrin.   Please return if you have any other emergent concerns.

## 2024-01-26 NOTE — ED Triage Notes (Signed)
 Pt via pov from home with cough, generalized body aches, fatigue that began 2 days ago. Pt alert & oriented, nad noted.

## 2024-02-01 DIAGNOSIS — E1165 Type 2 diabetes mellitus with hyperglycemia: Secondary | ICD-10-CM | POA: Diagnosis not present

## 2024-02-01 DIAGNOSIS — I10 Essential (primary) hypertension: Secondary | ICD-10-CM | POA: Diagnosis not present

## 2024-02-03 DIAGNOSIS — J439 Emphysema, unspecified: Secondary | ICD-10-CM | POA: Diagnosis not present

## 2024-02-03 DIAGNOSIS — M179 Osteoarthritis of knee, unspecified: Secondary | ICD-10-CM | POA: Diagnosis not present

## 2024-02-08 ENCOUNTER — Encounter: Payer: Self-pay | Admitting: Orthopaedic Surgery

## 2024-02-08 ENCOUNTER — Other Ambulatory Visit (INDEPENDENT_AMBULATORY_CARE_PROVIDER_SITE_OTHER)

## 2024-02-08 ENCOUNTER — Ambulatory Visit: Admitting: Orthopaedic Surgery

## 2024-02-08 ENCOUNTER — Telehealth: Payer: Self-pay

## 2024-02-08 DIAGNOSIS — M1712 Unilateral primary osteoarthritis, left knee: Secondary | ICD-10-CM

## 2024-02-08 NOTE — Telephone Encounter (Signed)
Please submit for left knee gel inj °

## 2024-02-08 NOTE — Progress Notes (Signed)
 Office Visit Note   Patient: Jacqueline Barnett           Date of Birth: [birth year]           MRN: [id number] Visit Date: 02/08/2024              Requested by: Jearldine Mina, MD [address] [place],  [place] [address] PCP: Jearldine Mina, MD   Assessment & Plan: Visit Diagnoses:  1. Primary osteoarthritis of left knee     Plan: History of Present Illness Jacqueline Barnett is a 78 year old female who presents with left knee pain.  She has experienced left knee pain for a couple of months. She previously received gel injections a few years ago, which provided relief. Currently, she uses Tylenol , though the dose and frequency are unspecified. There are no recent injuries to the knee.  Left knee exam: Pain occurs primarily when bending down or kneeling. There is no swelling or joint effusion. Full extension and flexion of the knee are maintained, with no tenderness along the joint line unless kneeling.  Results RADIOLOGY Left knee X-ray: Arthritis (02/08/2024)  Assessment and Plan Left knee arthritis flare-up Chronic left knee arthritis with recent flare-up. X-rays confirm arthritis. Previous gel injections effective. She opted to wait for gel injection approval. - Submit for prior approval for gel injection. - Contact her when gel injection is approved.  This patient is diagnosed with osteoarthritis of the knee(s).    Radiographs show evidence of joint space narrowing, osteophytes, subchondral sclerosis and/or subchondral cysts.  This patient has knee pain which interferes with functional and activities of daily living.    This patient has experienced inadequate response, adverse effects and/or intolerance with conservative treatments such as acetaminophen , NSAIDS, topical creams, physical therapy or regular exercise, knee bracing and/or weight loss.   This patient has experienced inadequate response or has a contraindication to intra articular steroid  injections for at least 3 months.   This patient is not scheduled to have a total knee replacement within 6 months of starting treatment with viscosupplementation. Follow-Up Instructions: No follow-ups on file.   Orders:  Orders Placed This Encounter  Procedures   XR KNEE 3 VIEW LEFT   No orders of the defined types were placed in this encounter.     Procedures: No procedures performed   Clinical Data: No additional findings.   Subjective: Chief Complaint  Patient presents with   Left Knee - Pain    HPI  Review of Systems  Constitutional: Negative.   HENT: Negative.    Eyes: Negative.   Respiratory: Negative.    Cardiovascular: Negative.   Endocrine: Negative.   Musculoskeletal: Negative.   Neurological: Negative.   Hematological: Negative.   Psychiatric/Behavioral: Negative.    All other systems reviewed and are negative.    Objective: Vital Signs: There were no vitals taken for this visit.  Physical Exam Vitals and nursing note reviewed.  Constitutional:      Appearance: She is well-developed.  HENT:     Head: Atraumatic.     Nose: Nose normal.  Eyes:     Extraocular Movements: Extraocular movements intact.  Cardiovascular:     Pulses: Normal pulses.  Pulmonary:     Effort: Pulmonary effort is normal.  Abdominal:     Palpations: Abdomen is soft.  Musculoskeletal:     Cervical back: Neck supple.  Skin:    General: Skin is warm.     Capillary Refill: Capillary  refill takes less than 2 seconds.  Neurological:     Mental Status: She is alert. Mental status is at baseline.  Psychiatric:        Behavior: Behavior normal.        Thought Content: Thought content normal.        Judgment: Judgment normal.     Ortho Exam  Specialty Comments:  No specialty comments available.  Imaging: XR KNEE 3 VIEW LEFT Result Date: 02/08/2024 Xrays show mild osteoarthritis without any acute abnormalities.    PMFS History: Patient Active Problem List    Diagnosis Date Noted   Prolonged QT interval 09/19/2019   Acute hypoxemic respiratory failure (HCC) 09/19/2019   Pneumonia due to COVID-19 virus 09/18/2019   DM2 (diabetes mellitus, type 2) (HCC) 09/18/2019   Abnormal ECG 09/18/2019   HTN (hypertension) 09/18/2019   Elevated troponin 09/18/2019   Past Medical History:  Diagnosis Date   Diabetes mellitus without complication (HCC)    Hypercholesteremia    Hypertension     Family History  Problem Relation Age of Onset   Diabetes Other     Past Surgical History:  Procedure Laterality Date   ABDOMINAL HYSTERECTOMY     FOOT SURGERY     Social History   Occupational History   Not on file  Tobacco Use   Smoking status: Former    Types: Cigarettes   Smokeless tobacco: Never  Vaping Use   Vaping status: Never Used  Substance and Sexual Activity   Alcohol use: No   Drug use: Never   Sexual activity: Not on file

## 2024-02-09 ENCOUNTER — Other Ambulatory Visit: Payer: Self-pay | Admitting: Cardiovascular Disease

## 2024-02-14 DIAGNOSIS — H903 Sensorineural hearing loss, bilateral: Secondary | ICD-10-CM | POA: Diagnosis not present

## 2024-02-17 DIAGNOSIS — E1165 Type 2 diabetes mellitus with hyperglycemia: Secondary | ICD-10-CM | POA: Diagnosis not present

## 2024-02-17 DIAGNOSIS — I10 Essential (primary) hypertension: Secondary | ICD-10-CM | POA: Diagnosis not present

## 2024-02-17 DIAGNOSIS — G4733 Obstructive sleep apnea (adult) (pediatric): Secondary | ICD-10-CM | POA: Diagnosis not present

## 2024-02-21 ENCOUNTER — Telehealth: Payer: Self-pay | Admitting: Cardiology

## 2024-02-21 NOTE — Telephone Encounter (Signed)
 What problem are you experiencing? Patient states that she feels like she cannot breathe when she is wearing the mask. She said that when she takes her dentures out it feels like air is leaking out. She is supposed to be getting fitted for a new mask but did want me to let Dr. Micael Adas know. She is scheduled for a compliance visit on 06/23  Who is your medical equipment company? [employer]  3)    If patient is calling about their sleep study results please route to [facility] Sleep Study Pool.   Please route to the sleep study coordinator.

## 2024-02-23 DIAGNOSIS — G4733 Obstructive sleep apnea (adult) (pediatric): Secondary | ICD-10-CM | POA: Diagnosis not present

## 2024-03-01 NOTE — Telephone Encounter (Signed)
 Per Dr Micael Adas, If she sleeps with her dentures out please make sure the DME fits her with her dentures out.

## 2024-03-03 DIAGNOSIS — E1165 Type 2 diabetes mellitus with hyperglycemia: Secondary | ICD-10-CM | POA: Diagnosis not present

## 2024-03-03 DIAGNOSIS — I10 Essential (primary) hypertension: Secondary | ICD-10-CM | POA: Diagnosis not present

## 2024-03-16 DIAGNOSIS — Z860101 Personal history of adenomatous and serrated colon polyps: Secondary | ICD-10-CM | POA: Diagnosis not present

## 2024-03-16 DIAGNOSIS — K648 Other hemorrhoids: Secondary | ICD-10-CM | POA: Diagnosis not present

## 2024-03-16 DIAGNOSIS — K5730 Diverticulosis of large intestine without perforation or abscess without bleeding: Secondary | ICD-10-CM | POA: Diagnosis not present

## 2024-03-16 DIAGNOSIS — D124 Benign neoplasm of descending colon: Secondary | ICD-10-CM | POA: Diagnosis not present

## 2024-03-16 DIAGNOSIS — Z09 Encounter for follow-up examination after completed treatment for conditions other than malignant neoplasm: Secondary | ICD-10-CM | POA: Diagnosis not present

## 2024-03-16 DIAGNOSIS — K644 Residual hemorrhoidal skin tags: Secondary | ICD-10-CM | POA: Diagnosis not present

## 2024-03-18 DIAGNOSIS — E1165 Type 2 diabetes mellitus with hyperglycemia: Secondary | ICD-10-CM | POA: Diagnosis not present

## 2024-03-18 DIAGNOSIS — I10 Essential (primary) hypertension: Secondary | ICD-10-CM | POA: Diagnosis not present

## 2024-03-19 DIAGNOSIS — G4733 Obstructive sleep apnea (adult) (pediatric): Secondary | ICD-10-CM | POA: Diagnosis not present

## 2024-03-20 DIAGNOSIS — D124 Benign neoplasm of descending colon: Secondary | ICD-10-CM | POA: Diagnosis not present

## 2024-03-29 ENCOUNTER — Ambulatory Visit: Admitting: Cardiology

## 2024-04-02 DIAGNOSIS — E1165 Type 2 diabetes mellitus with hyperglycemia: Secondary | ICD-10-CM | POA: Diagnosis not present

## 2024-04-02 DIAGNOSIS — I10 Essential (primary) hypertension: Secondary | ICD-10-CM | POA: Diagnosis not present

## 2024-04-09 ENCOUNTER — Ambulatory Visit: Attending: Cardiology | Admitting: Cardiology

## 2024-04-09 ENCOUNTER — Encounter: Payer: Self-pay | Admitting: Cardiology

## 2024-04-09 VITALS — BP 138/82 | HR 64 | Ht 63.0 in | Wt 151.2 lb

## 2024-04-09 DIAGNOSIS — G4733 Obstructive sleep apnea (adult) (pediatric): Secondary | ICD-10-CM

## 2024-04-09 DIAGNOSIS — I10 Essential (primary) hypertension: Secondary | ICD-10-CM | POA: Diagnosis not present

## 2024-04-09 NOTE — Patient Instructions (Signed)
 Medication Instructions:  Your physician recommends that you continue on your current medications as directed. Please refer to the Current Medication list given to you today.  *If you need a refill on your cardiac medications before your next appointment, please call your pharmacy*  Lab Work: None.  If you have labs (blood work) drawn today and your tests are completely normal, you will receive your results only by: MyChart Message (if you have MyChart) OR A paper copy in the mail If you have any lab test that is abnormal or we need to change your treatment, we will call you to review the results.  Testing/Procedures: None.  Follow-Up: At [facility], you and your health needs are our priority.  As part of our continuing mission to provide you with exceptional heart care, our providers are all part of one team.  This team includes your primary Cardiologist (physician) and Advanced Practice Providers or APPs (Physician Assistants and Nurse Practitioners) who all work together to provide you with the care you need, when you need it.  Your next appointment:   8 week(s)  Provider:   Dr. Wilbert Bihari, MD   We recommend signing up for the patient portal called MyChart.  Sign up information is provided on this After Visit Summary.  MyChart is used to connect with patients for Virtual Visits (Telemedicine).  Patients are able to view lab/test results, encounter notes, upcoming appointments, etc.  Non-urgent messages can be sent to your provider as well.   To learn more about what you can do with MyChart, go to [email or web address].   Other Instructions Dr. Bihari has ordered a DME mask fitting for you. Someone from your DME company should be calling you to set up an appointment.

## 2024-04-09 NOTE — Progress Notes (Signed)
 Sleep Medicine CONSULT Note    Date:  04/09/2024   ID:  Jacqueline Barnett, DOB 05-08-1946, MRN [id number]  PCP:  Ransom Other, MD  Cardiologist: Maude Emmer, MD   Chief Complaint  Patient presents with   New Patient (Initial Visit)    Obstructive sleep apnea    History of Present Illness:  Jacqueline Barnett is a 78 y.o. female who is being seen today for the evaluation of obstructive sleep apnea at the request of Maude Emmer, MD.  This is a 78 year old female with a history diabetes, hyperlipidemia and hypertension.  She was seen by [name], NP in October 2020 for complaint of excessive daytime sleepiness with a STOP-BANG score of 5.  She underwent home sleep study showing severe obstructive sleep apnea with an AHI of 29.7/h and mild central sleep apnea with an central AHI of 10.1/h.  She also had nocturnal hypoxemia with lowest O2 saturation 82% and O2 sats less than 88% for 28 minutes.  She underwent PAP titration 12/28/2023 and was started on BiPAP at 20/16 cm H2O.  She is now referred for sleep medicine consultation for treatment of obstructive sleep apnea  She says that the BiPAP has really been a struggle.  She has had problems with the mask staying on her face at night and feels the air is too high.  She currently is using a full face mask but it does not stay on her face well.  Since going on she has not really noticed an improvement in her daytime sleepiness because she cannot wear the mask and has not been using it.   She denies any significant mouth or nasal dryness or nasal congestion.   Past Medical History:  Diagnosis Date   Diabetes mellitus without complication (HCC)    Hypercholesteremia    Hypertension     Past Surgical History:  Procedure Laterality Date   ABDOMINAL HYSTERECTOMY     FOOT SURGERY      Current Medications: Current Meds  Medication Sig   ACCU-CHEK SMARTVIEW test strip    albuterol  (VENTOLIN  HFA) 108 (90 Base) MCG/ACT inhaler  Inhale 1 puff into the lungs every 6 (six) hours as needed for shortness of breath or wheezing.   aspirin  81 MG chewable tablet Chew by mouth daily.   Blood Pressure Monitoring (BLOOD PRESSURE CUFF) MISC 1 each by Does not apply route daily.   BREO ELLIPTA 100-25 MCG/ACT AEPB Inhale 1 puff into the lungs daily.   dapagliflozin propanediol (FARXIGA) 10 MG TABS tablet Take 10 mg by mouth daily.   famotidine (PEPCID) 20 MG tablet Take 20 mg by mouth 2 (two) times daily.   fluticasone  (FLONASE ) 50 MCG/ACT nasal spray Place 2 sprays into both nostrils daily.   hydrochlorothiazide (HYDRODIURIL) 25 MG tablet Take 25 mg by mouth daily.   LINZESS 145 MCG CAPS capsule Take 145 mcg by mouth daily.   losartan  (COZAAR ) 100 MG tablet Take 1 tablet by mouth daily.   metFORMIN (GLUCOPHAGE) 500 MG tablet Take by mouth 2 (two) times daily with a meal.   metoprolol  succinate (TOPROL -XL) 25 MG 24 hr tablet Take 1 tablet (25 mg total) by mouth daily. Can take an additional tablet  as needed   Multiple Vitamins-Minerals (MULTIVITAMIN ADULTS 50+) TABS Take 1 tablet by mouth daily.   nystatin cream (MYCOSTATIN) as needed.   Omega-3 Fatty Acids (FISH OIL OMEGA-3 PO) Take 1 capsule by mouth daily.   simvastatin  (ZOCOR ) 40 MG tablet Take  40 mg by mouth daily at 6 PM.    triamcinolone ointment (KENALOG) 0.5 % Apply 1 Application topically as needed.    Allergies:   Patient has no known allergies.   Social History   Socioeconomic History   Marital status: Widowed    Spouse name: Not on file   Number of children: Not on file   Years of education: Not on file   Highest education level: Not on file  Occupational History   Not on file  Tobacco Use   Smoking status: Former    Types: Cigarettes   Smokeless tobacco: Never  Vaping Use   Vaping status: Never Used  Substance and Sexual Activity   Alcohol use: No   Drug use: Never   Sexual activity: Not on file  Other Topics Concern   Not on file  Social History  Narrative   Not on file   Social Drivers of Health   Financial Resource Strain: Not on file  Food Insecurity: Not on file  Transportation Needs: Not on file  Physical Activity: Not on file  Stress: Not on file  Social Connections: Not on file     Family History:  The patient's family history includes Diabetes in an other family member.   ROS:   Please see the history of present illness.    ROS All other systems reviewed and are negative.      No data to display             PHYSICAL EXAM:   VS:  BP 138/82   Pulse 64   Ht 5' 3 (1.6 m)   Wt 151 lb 3.2 oz (68.6 kg)   SpO2 99%   BMI 26.78 kg/m    GEN: Well nourished, well developed, in no acute distress  HEENT: normal  Neck: no JVD, carotid bruits, or masses Cardiac: RRR; no murmurs, rubs, or gallops,no edema.  Intact distal pulses bilaterally.  Respiratory:  clear to auscultation bilaterally, normal work of breathing GI: soft, nontender, nondistended, + BS MS: no deformity or atrophy  Skin: warm and dry, no rash Neuro:  Alert and Oriented x 3, Strength and sensation are intact Psych: euthymic mood, full affect  Wt Readings from Last 3 Encounters:  04/09/24 151 lb 3.2 oz (68.6 kg)  01/26/24 156 lb 15.5 oz (71.2 kg)  01/20/24 157 lb (71.2 kg)      Studies/Labs Reviewed:   Home sleep study, CPAP titration, PAP compliance download  Recent Labs: 05/10/2023: ALT 14; Magnesium  1.5; TSH 1.550 10/21/2023: BUN 26; Creatinine, Ser 1.42; Hemoglobin 14.3; Platelets 262; Potassium 3.6; Sodium 136    ASSESSMENT:    1. OSA (obstructive sleep apnea)   2. Essential hypertension      PLAN:  In order of problems listed above:  OSA - she has not been able to use her device much because she cannot sleep with the mask she currently is using. The PAP download performed by his DME was personally reviewed and interpreted by me today and showed an AHI of 3.2 /hr on BiPAP at 20/16 cm H2O with 0%.  compliance in using more than  4 hours nightly.  The patient has been using and benefiting from PAP use and will continue to benefit from therapy.  - She is only used her BiPAP 13 out of the past 30 days and on average uses it 1 hour and 8 minutes a night. - she did not tolerate the nasal cushion mask or under the nose  full face mask.   - she does not know if she breathes through her nose - I am going to send her back to DME for a full face mask fitting - Encouraged her to be more compliant with her device  Hypertension - BP controlled on exam today - Continue HCTZ 25 mg daily, losartan  100 mg daily, Toprol  XL 25 mg daily with as needed refills  Followup with me in 8 weeks   Time Spent: 30 minutes total time of encounter, including 15 minutes spent in face-to-face patient care on the date of this encounter. This time includes coordination of care and counseling regarding above mentioned problem list. Remainder of non-face-to-face time involved reviewing chart documents/testing relevant to the patient encounter and documentation in the medical record. I have independently reviewed documentation from referring provider  Medication Adjustments/Labs and Tests Ordered: Current medicines are reviewed at length with the patient today.  Concerns regarding medicines are outlined above.  Medication changes, Labs and Tests ordered today are listed in the Patient Instructions below.  There are no Patient Instructions on file for this visit.   Signed, Wilbert Bihari, MD  04/09/2024 11:53 AM    [facility] [address] [place], [place], [place]  [address] Phone: [phone]; Fax: [phone]

## 2024-04-10 ENCOUNTER — Telehealth: Payer: Self-pay

## 2024-04-10 DIAGNOSIS — I471 Supraventricular tachycardia, unspecified: Secondary | ICD-10-CM

## 2024-04-10 DIAGNOSIS — G4733 Obstructive sleep apnea (adult) (pediatric): Secondary | ICD-10-CM

## 2024-04-10 DIAGNOSIS — R0602 Shortness of breath: Secondary | ICD-10-CM

## 2024-04-10 DIAGNOSIS — I10 Essential (primary) hypertension: Secondary | ICD-10-CM

## 2024-04-10 DIAGNOSIS — E785 Hyperlipidemia, unspecified: Secondary | ICD-10-CM

## 2024-04-10 DIAGNOSIS — E1165 Type 2 diabetes mellitus with hyperglycemia: Secondary | ICD-10-CM

## 2024-04-10 DIAGNOSIS — R9431 Abnormal electrocardiogram [ECG] [EKG]: Secondary | ICD-10-CM

## 2024-04-10 NOTE — Telephone Encounter (Signed)
-----   Message from Wilbert Bihari sent at 04/09/2024 12:01 PM EDT ----- Please get patient in with DME for a full face mask fitting

## 2024-04-10 NOTE — Telephone Encounter (Signed)
 Order for a full face mask fitting sent to [facility] today.

## 2024-04-13 DIAGNOSIS — I471 Supraventricular tachycardia, unspecified: Secondary | ICD-10-CM | POA: Diagnosis not present

## 2024-04-13 DIAGNOSIS — G4733 Obstructive sleep apnea (adult) (pediatric): Secondary | ICD-10-CM | POA: Diagnosis not present

## 2024-04-13 DIAGNOSIS — I5189 Other ill-defined heart diseases: Secondary | ICD-10-CM | POA: Diagnosis not present

## 2024-04-13 DIAGNOSIS — I700 Atherosclerosis of aorta: Secondary | ICD-10-CM | POA: Diagnosis not present

## 2024-04-13 DIAGNOSIS — E1122 Type 2 diabetes mellitus with diabetic chronic kidney disease: Secondary | ICD-10-CM | POA: Diagnosis not present

## 2024-04-13 DIAGNOSIS — I10 Essential (primary) hypertension: Secondary | ICD-10-CM | POA: Diagnosis not present

## 2024-04-13 DIAGNOSIS — K5904 Chronic idiopathic constipation: Secondary | ICD-10-CM | POA: Diagnosis not present

## 2024-04-13 DIAGNOSIS — N1831 Chronic kidney disease, stage 3a: Secondary | ICD-10-CM | POA: Diagnosis not present

## 2024-04-13 DIAGNOSIS — E785 Hyperlipidemia, unspecified: Secondary | ICD-10-CM | POA: Diagnosis not present

## 2024-04-13 DIAGNOSIS — J439 Emphysema, unspecified: Secondary | ICD-10-CM | POA: Diagnosis not present

## 2024-04-17 DIAGNOSIS — I10 Essential (primary) hypertension: Secondary | ICD-10-CM | POA: Diagnosis not present

## 2024-04-17 DIAGNOSIS — E1165 Type 2 diabetes mellitus with hyperglycemia: Secondary | ICD-10-CM | POA: Diagnosis not present

## 2024-04-18 DIAGNOSIS — G4733 Obstructive sleep apnea (adult) (pediatric): Secondary | ICD-10-CM | POA: Diagnosis not present

## 2024-04-20 NOTE — Telephone Encounter (Signed)
 Patient is following up. She says she was advised to call back to have a new order sent if she hasn't heard from [facility], and she has not heard from anyone.

## 2024-05-03 DIAGNOSIS — E1165 Type 2 diabetes mellitus with hyperglycemia: Secondary | ICD-10-CM | POA: Diagnosis not present

## 2024-05-03 DIAGNOSIS — I10 Essential (primary) hypertension: Secondary | ICD-10-CM | POA: Diagnosis not present

## 2024-05-19 DIAGNOSIS — G4733 Obstructive sleep apnea (adult) (pediatric): Secondary | ICD-10-CM | POA: Diagnosis not present

## 2024-05-23 DIAGNOSIS — G4733 Obstructive sleep apnea (adult) (pediatric): Secondary | ICD-10-CM | POA: Diagnosis not present

## 2024-06-03 DIAGNOSIS — E1165 Type 2 diabetes mellitus with hyperglycemia: Secondary | ICD-10-CM | POA: Diagnosis not present

## 2024-06-03 DIAGNOSIS — I10 Essential (primary) hypertension: Secondary | ICD-10-CM | POA: Diagnosis not present

## 2024-06-07 ENCOUNTER — Ambulatory Visit: Attending: Cardiology | Admitting: Cardiology

## 2024-06-07 ENCOUNTER — Telehealth: Payer: Self-pay | Admitting: *Deleted

## 2024-06-07 ENCOUNTER — Encounter: Payer: Self-pay | Admitting: Cardiology

## 2024-06-07 VITALS — BP 124/66 | HR 67 | Ht 63.0 in | Wt 153.8 lb

## 2024-06-07 DIAGNOSIS — I10 Essential (primary) hypertension: Secondary | ICD-10-CM | POA: Diagnosis not present

## 2024-06-07 DIAGNOSIS — G4733 Obstructive sleep apnea (adult) (pediatric): Secondary | ICD-10-CM | POA: Insufficient documentation

## 2024-06-07 NOTE — Patient Instructions (Addendum)
 Medication Instructions:  Your physician recommends that you continue on your current medications as directed. Please refer to the Current Medication list given to you today.  *If you need a refill on your cardiac medications before your next appointment, please call your pharmacy*  Lab Work: None.  If you have labs (blood work) drawn today and your tests are completely normal, you will receive your results only by: MyChart Message (if you have MyChart) OR A paper copy in the mail If you have any lab test that is abnormal or we need to change your treatment, we will call you to review the results.  Testing/Procedures: None.  Follow-Up: At [facility], you and your health needs are our priority.  As part of our continuing mission to provide you with exceptional heart care, our providers are all part of one team.  This team includes your primary Cardiologist (physician) and Advanced Practice Providers or APPs (Physician Assistants and Nurse Practitioners) who all work together to provide you with the care you need, when you need it.  Your next appointment:   3 months  Provider:   Dr. Wilbert Bihari, MD

## 2024-06-07 NOTE — Telephone Encounter (Signed)
-----   Message from Jacqueline Barnett sent at 06/07/2024 12:03 PM EDT ----- Please set up an in office appt with [facility] for a mask fitting her mask is still really leaking

## 2024-06-07 NOTE — Telephone Encounter (Signed)
 Order sent to [facility] for mask fit.

## 2024-06-07 NOTE — Progress Notes (Signed)
 Sleep Medicine  Note    Date:  06/07/2024   ID:  [name], DOB [birth year], MRN [id number]  PCP:  Ransom Other, MD  Cardiologist: Maude Emmer, MD   Chief Complaint  Patient presents with   Sleep Apnea   Hypertension    History of Present Illness:  Jacqueline Barnett is a 78 y.o. female who is being seen today for the evaluation of obstructive sleep apnea at the request of Maude Emmer and, MD.  This is a 78 year old female with a history of diabetes mellitus, SVT, hyperlipidemia and hypertension.  At her office visit with Jacqueline Barnett on 07/26/2023 she complained of excessive daytime sleepiness with a STOP-BANG score of 5.  She underwent home sleep study showing severe obstructive sleep apnea with an AHI of 29.7/h and mild central sleep apnea with a central AHI of 10.1/h.  O2 saturation nadir was 82%.  O2 saturations were less than 88% for 28 minutes.  This was consistent with nocturnal hypoxemia.  She underwent CPAP titration but due to ongoing respiratory events had to be titrated to BiPAP.  She was started on ResMed BiPAP 20/16 cm H2O with a small [employer] Simplus full facemask.  She was seen by me on 04/09/2024 and had only used her BiPAP 13 out of the 30 days.  She said she could not tolerate the nasal cushion mask or the under the nose fullface mask.  She was referred back to the DME for a fullface mask fitting.  She is now back for follow-up.  She is doing well with her PAP device and thinks that she has gotten used to it.  She got a new FFM which she likes much better and is not leaking. She  feels the pressure is adequate.  Since going on PAP she feels rested in the am but still get sleepy during the day if she sits down.  She has some mouth and nasal dryness.    She does not think that she snores. Patient denies any episodes of bruxism, restless legs, No gagging hallucinations or cataplectic events.    Past Medical History:  Diagnosis Date   Diabetes  mellitus without complication (HCC)    Hypercholesteremia    Hypertension    OSA treated with BiPAP    severe obstructive sleep apnea with an AHI of 29.7/h and mild central sleep apnea with a central AHI of 10.1/h.  O2 saturation nadir was 82%.  O2 saturations were less than 88% for 28 minutes.  On BIPAP at 20/16cm H2O    Past Surgical History:  Procedure Laterality Date   ABDOMINAL HYSTERECTOMY     FOOT SURGERY      Current Medications: Current Meds  Medication Sig   ACCU-CHEK SMARTVIEW test strip    albuterol  (VENTOLIN  HFA) 108 (90 Base) MCG/ACT inhaler Inhale 1 puff into the lungs every 6 (six) hours as needed for shortness of breath or wheezing.   aspirin  81 MG chewable tablet Chew by mouth daily.   Blood Pressure Monitoring (BLOOD PRESSURE CUFF) MISC 1 each by Does not apply route daily.   BREO ELLIPTA 100-25 MCG/ACT AEPB Inhale 1 puff into the lungs daily.   dapagliflozin propanediol (FARXIGA) 10 MG TABS tablet Take 10 mg by mouth daily.   famotidine (PEPCID) 20 MG tablet Take 20 mg by mouth 2 (two) times daily.   fluticasone  (FLONASE ) 50 MCG/ACT nasal spray Place 2 sprays into both nostrils daily.   hydrochlorothiazide (HYDRODIURIL) 25 MG  tablet Take 25 mg by mouth daily.   LINZESS 145 MCG CAPS capsule Take 145 mcg by mouth daily.   losartan  (COZAAR ) 100 MG tablet Take 1 tablet by mouth daily.   metFORMIN (GLUCOPHAGE) 500 MG tablet Take by mouth 2 (two) times daily with a meal.   metoprolol  succinate (TOPROL -XL) 25 MG 24 hr tablet Take 1 tablet (25 mg total) by mouth daily. Can take an additional tablet  as needed   Multiple Vitamins-Minerals (MULTIVITAMIN ADULTS 50+) TABS Take 1 tablet by mouth daily.   nystatin cream (MYCOSTATIN) as needed.   Omega-3 Fatty Acids (FISH OIL OMEGA-3 PO) Take 1 capsule by mouth daily.   simvastatin  (ZOCOR ) 40 MG tablet Take 40 mg by mouth daily at 6 PM.    triamcinolone ointment (KENALOG) 0.5 % Apply 1 Application topically as needed.     Allergies:   Patient has no known allergies.   Social History   Socioeconomic History   Marital status: Widowed    Spouse name: Not on file   Number of children: Not on file   Years of education: Not on file   Highest education level: Not on file  Occupational History   Not on file  Tobacco Use   Smoking status: Former    Types: Cigarettes   Smokeless tobacco: Never  Vaping Use   Vaping status: Never Used  Substance and Sexual Activity   Alcohol use: No   Drug use: Never   Sexual activity: Not on file  Other Topics Concern   Not on file  Social History Narrative   Not on file   Social Drivers of Health   Financial Resource Strain: Not on file  Food Insecurity: Not on file  Transportation Needs: Not on file  Physical Activity: Not on file  Stress: Not on file  Social Connections: Not on file     Family History:  The patient's family history includes Diabetes in an other family member.   ROS:   Please see the history of present illness.    ROS All other systems reviewed and are negative.      No data to display             PHYSICAL EXAM:   VS:  BP 124/66   Pulse 67   Ht 5' 3 (1.6 m)   Wt 153 lb 12.8 oz (69.8 kg)   SpO2 97%   BMI 27.24 kg/m    GEN: Well nourished, well developed, in no acute distress  HEENT: normal  Neck: no JVD, carotid bruits, or masses Cardiac: RRR; no murmurs, rubs, or gallops,no edema.  Intact distal pulses bilaterally.  Respiratory:  clear to auscultation bilaterally, normal work of breathing GI: soft, nontender, nondistended, + BS MS: no deformity or atrophy  Skin: warm and dry, no rash Neuro:  Alert and Oriented x 3, Strength and sensation are intact Psych: euthymic mood, full affect  Wt Readings from Last 3 Encounters:  06/07/24 153 lb 12.8 oz (69.8 kg)  04/09/24 151 lb 3.2 oz (68.6 kg)  01/26/24 156 lb 15.5 oz (71.2 kg)      Studies/Labs Reviewed:   PAP compliance download  Recent Labs: 10/21/2023: BUN  26; Creatinine, Ser 1.42; Hemoglobin 14.3; Platelets 262; Potassium 3.6; Sodium 136     ASSESSMENT:    1. OSA treated with BiPAP   2. Essential hypertension      PLAN:  In order of problems listed above:  OSA - The patient is tolerating PAP therapy  well without any problems. The PAP download performed by his DME was personally reviewed and interpreted by me today and showed an AHI of 6.4 /hr on BiPAP on 20/16 cm H2O with 50% compliance in using more than 4 hours nightly.  The patient has been using and benefiting from PAP use and will continue to benefit from therapy.  - Compliance is improved since last office visit but still short of the 70%.  I encouraged her to try to use her device at least 5 hours every night.  She has been averaging 3 hours and 45 minutes - It shows that the mask is really leaking still so I will get her an appt with the DME for mask fitting>>likely reason for elevated AHI  Hypertension - BP controlled on exam today - Continue HCTZ 25 mg daily, losartan  100 mg daily, Toprol  XL 25 mg daily with as needed refills  Followup with me in 3 months  Time Spent: 20 minutes total time of encounter, including 15 minutes spent in face-to-face patient care on the date of this encounter. This time includes coordination of care and counseling regarding above mentioned problem list. Remainder of non-face-to-face time involved reviewing chart documents/testing relevant to the patient encounter and documentation in the medical record. I have independently reviewed documentation from referring provider  Medication Adjustments/Labs and Tests Ordered: Current medicines are reviewed at length with the patient today.  Concerns regarding medicines are outlined above.  Medication changes, Labs and Tests ordered today are listed in the Patient Instructions below.  There are no Patient Instructions on file for this visit.   Signed, Wilbert Bihari, MD  06/07/2024 11:59 AM    [facility] [address] [place], [place], [place]  [address] Phone: [phone]; Fax: [phone]

## 2024-07-02 NOTE — Telephone Encounter (Signed)
 Reached out to patient about FFM order.

## 2024-07-03 DIAGNOSIS — I10 Essential (primary) hypertension: Secondary | ICD-10-CM | POA: Diagnosis not present

## 2024-07-03 DIAGNOSIS — E1165 Type 2 diabetes mellitus with hyperglycemia: Secondary | ICD-10-CM | POA: Diagnosis not present

## 2024-07-03 NOTE — Telephone Encounter (Signed)
 Called and spoke to patient and she states she connected with [facility] and they are suppose to call the patient back. Patient is aware the order was sent to [facility] for mask fit.

## 2024-07-16 DIAGNOSIS — I10 Essential (primary) hypertension: Secondary | ICD-10-CM | POA: Diagnosis not present

## 2024-07-16 DIAGNOSIS — E1165 Type 2 diabetes mellitus with hyperglycemia: Secondary | ICD-10-CM | POA: Diagnosis not present

## 2024-07-19 DIAGNOSIS — G4733 Obstructive sleep apnea (adult) (pediatric): Secondary | ICD-10-CM | POA: Diagnosis not present

## 2024-08-06 ENCOUNTER — Encounter: Payer: Self-pay | Admitting: Radiology

## 2024-09-10 ENCOUNTER — Telehealth: Payer: Self-pay | Admitting: *Deleted

## 2024-09-10 NOTE — Telephone Encounter (Signed)
 Called and unable to leave message that paperwork faxed after provider, Dr. Delford signed formed. Paper work faxed to [employer] @[phone]

## 2024-09-10 NOTE — Telephone Encounter (Signed)
 Patient returned RN's call regarding paperwork.

## 2024-09-10 NOTE — Telephone Encounter (Signed)
 Spoke with pt, aware paperwork has been faxed.

## 2024-10-03 ENCOUNTER — Ambulatory Visit: Admitting: Cardiology

## 2024-10-19 ENCOUNTER — Other Ambulatory Visit: Payer: Self-pay | Admitting: Internal Medicine

## 2024-10-19 DIAGNOSIS — Z1231 Encounter for screening mammogram for malignant neoplasm of breast: Secondary | ICD-10-CM

## 2024-10-30 ENCOUNTER — Ambulatory Visit: Payer: Self-pay

## 2024-11-05 ENCOUNTER — Telehealth: Payer: Self-pay | Admitting: Cardiovascular Disease

## 2024-11-05 MED ORDER — METOPROLOL SUCCINATE ER 25 MG PO TB24: 25.0000 mg | ORAL_TABLET | Freq: Every day | ORAL | 1 refills | Status: AC

## 2024-11-13 ENCOUNTER — Ambulatory Visit

## 2024-12-26 ENCOUNTER — Ambulatory Visit: Payer: Self-pay | Admitting: Cardiology

## 2025-03-04 ENCOUNTER — Ambulatory Visit: Admitting: Cardiovascular Disease
